# Patient Record
Sex: Male | Born: 1970 | Race: Black or African American | Hispanic: No | Marital: Married | Smoking: Current every day smoker
Health system: Southern US, Community
[De-identification: ages and names within clinical notes are randomized; demographics above are authoritative.]

## PROBLEM LIST (undated history)

## (undated) DIAGNOSIS — I10 Essential (primary) hypertension: Secondary | ICD-10-CM

---

## 2001-08-13 ENCOUNTER — Emergency Department (HOSPITAL_COMMUNITY): Admission: EM | Admit: 2001-08-13 | Discharge: 2001-08-13 | Payer: Self-pay | Admitting: Emergency Medicine

## 2001-10-27 ENCOUNTER — Emergency Department (HOSPITAL_COMMUNITY): Admission: EM | Admit: 2001-10-27 | Discharge: 2001-10-27 | Payer: Self-pay

## 2006-02-13 ENCOUNTER — Emergency Department (HOSPITAL_COMMUNITY): Admission: EM | Admit: 2006-02-13 | Discharge: 2006-02-13 | Payer: Self-pay | Admitting: Emergency Medicine

## 2007-09-04 ENCOUNTER — Emergency Department (HOSPITAL_COMMUNITY): Admission: EM | Admit: 2007-09-04 | Discharge: 2007-09-04 | Payer: Self-pay | Admitting: Physician Assistant

## 2014-08-10 ENCOUNTER — Encounter (HOSPITAL_COMMUNITY): Payer: Self-pay

## 2014-08-10 ENCOUNTER — Emergency Department (HOSPITAL_COMMUNITY)
Admission: EM | Admit: 2014-08-10 | Discharge: 2014-08-10 | Disposition: A | Discharge status: Home / Self Care | Payer: Self-pay | Attending: Emergency Medicine | Admitting: Emergency Medicine

## 2014-08-10 ENCOUNTER — Emergency Department (HOSPITAL_COMMUNITY): Payer: Self-pay

## 2014-08-10 DIAGNOSIS — Z720 Tobacco use: Secondary | ICD-10-CM | POA: Insufficient documentation

## 2014-08-10 DIAGNOSIS — B349 Viral infection, unspecified: Secondary | ICD-10-CM | POA: Insufficient documentation

## 2014-08-10 MED ORDER — KETOROLAC TROMETHAMINE 30 MG/ML IJ SOLN
30.0000 mg | Freq: Once | INTRAMUSCULAR | Status: AC
  Administered 2014-08-10: 30 mg via INTRAMUSCULAR
  Filled 2014-08-10: qty 1

## 2014-08-10 NOTE — ED Notes (Signed)
Pt states cough and cold like symptoms since Wednesday.  Chills but has not measured for fever.

## 2014-08-10 NOTE — ED Notes (Signed)
Pr alert x4 respirations easy non labored.

## 2014-08-10 NOTE — ED Provider Notes (Signed)
CSN: [id number]     Arrival date & time 08/10/14  4098 History   First MD Initiated Contact with Patient 08/10/14 0827     Chief Complaint  Patient presents with  . Cough  . Nasal Congestion     (Consider location/radiation/quality/duration/timing/severity/associated sxs/prior Treatment) Patient is a 44 y.o. male presenting with cough. The history is provided by the patient.  Cough Cough characteristics:  Non-productive Severity:  Mild Onset quality:  Gradual Duration:  4 days Timing:  Intermittent Progression:  Unchanged Chronicity:  New Smoker: yes   Context: upper respiratory infection   Relieved by:  Nothing Worsened by:  Nothing tried Ineffective treatments:  None tried Associated symptoms: chills, fever (subjective), myalgias and rhinorrhea   Associated symptoms: no chest pain, no headaches and no shortness of breath     History reviewed. No pertinent past medical history. History reviewed. No pertinent past surgical history. History reviewed. No pertinent family history. History  Substance Use Topics  . Smoking status: Current Every Day Smoker  . Smokeless tobacco: Not on file  . Alcohol Use: Yes     Comment: social    Review of Systems  Constitutional: Positive for fever (subjective) and chills.  HENT: Positive for rhinorrhea. Negative for drooling.   Eyes: Negative for pain.  Respiratory: Positive for cough. Negative for shortness of breath.   Cardiovascular: Negative for chest pain and leg swelling.  Gastrointestinal: Positive for diarrhea. Negative for nausea, vomiting and abdominal pain.  Genitourinary: Negative for dysuria and hematuria.       Right flank pain w/ heavy coughing  Musculoskeletal: Positive for myalgias. Negative for gait problem and neck pain.  Skin: Negative for color change.  Neurological: Negative for numbness and headaches.  Hematological: Negative for adenopathy.  Psychiatric/Behavioral: Negative for behavioral problems.  All  other systems reviewed and are negative.     Allergies  Review of patient's allergies indicates no known allergies.  Home Medications   Prior to Admission medications   Not on File   BP 128/88 mmHg  Pulse 83  Temp(Src) 99.2 F (37.3 C) (Oral)  Resp 18  SpO2 99% Physical Exam  Constitutional: He is oriented to person, place, and time. He appears well-developed and well-nourished.  HENT:  Head: Normocephalic and atraumatic.  Right Ear: External ear normal.  Left Ear: External ear normal.  Nose: Nose normal.  Mouth/Throat: Oropharynx is clear and moist. No oropharyngeal exudate.  Eyes: Conjunctivae and EOM are normal. Pupils are equal, round, and reactive to light.  Neck: Normal range of motion. Neck supple.  Cardiovascular: Normal rate, regular rhythm, normal heart sounds and intact distal pulses.  Exam reveals no gallop and no friction rub.   No murmur heard. Pulmonary/Chest: Effort normal and breath sounds normal. No respiratory distress. He has no wheezes.  Abdominal: Soft. Bowel sounds are normal. He exhibits no distension. There is no tenderness. There is no rebound and no guarding.  Musculoskeletal: Normal range of motion. He exhibits no edema or tenderness.  Neurological: He is alert and oriented to person, place, and time.  Skin: Skin is warm and dry.  Psychiatric: He has a normal mood and affect. His behavior is normal.  Nursing note and vitals reviewed.   ED Course  Procedures (including critical care time) Labs Review Labs Reviewed - No data to display  Imaging Review No results found.   EKG Interpretation None      MDM   Final diagnoses:  Viral syndrome    8:41 AM 43  y.o. male here w/ viral sx x 6 days. Subj fever, diarrhea, myalgias, sore throat, cough. AFVSS here. Appears well on exam. No need for imaging at this time.   9:02 AM:  I have discussed the diagnosis/risks/treatment options with the patient and believe the pt to be eligible for  discharge home to follow-up with his pcp as needed. We also discussed returning to the ED immediately if new or worsening sx occur. We discussed the sx which are most concerning (e.g., sob, intractable fever) that necessitate immediate return. Medications administered to the patient during their visit and any new prescriptions provided to the patient are listed below.  Medications given during this visit Medications  ketorolac (TORADOL) 30 MG/ML injection 30 mg (30 mg Intramuscular Given 08/10/14 0851)    New Prescriptions   No medications on file       Purvis SheffieldForrest Jamoni Hewes, MD 08/10/14 [phone]

## 2014-08-10 NOTE — Discharge Instructions (Signed)
Cough, Adult   A cough is a reflex. It helps you clear your throat and airways. A cough can help heal your body. A cough can last 2 or 3 weeks (acute) or may last more than 8 weeks (chronic). Some common causes of a cough can include an infection, allergy, or a cold.  HOME CARE  · Only take medicine as told by your doctor.  · If given, take your medicines (antibiotics) as told. Finish them even if you start to feel better.  · Use a cold steam vaporizer or humidifier in your home. This can help loosen thick spit (secretions).  · Sleep so you are almost sitting up (semi-upright). Use pillows to do this. This helps reduce coughing.  · Rest as needed.  · Stop smoking if you smoke.  GET HELP RIGHT AWAY IF:  · You have yellowish-white fluid (pus) in your thick spit.  · Your cough gets worse.  · Your medicine does not reduce coughing, and you are losing sleep.  · You cough up blood.  · You have trouble breathing.  · Your pain gets worse and medicine does not help.  · You have a fever.  MAKE SURE YOU:   · Understand these instructions.  · Will watch your condition.  · Will get help right away if you are not doing well or get worse.  Document Released: 02/26/2011 Document Revised: 10/30/2013 Document Reviewed: 02/26/2011  ExitCare® Patient Information ©2015 [employer]. This information is not intended to replace advice given to you by your health care provider. Make sure you discuss any questions you have with your health care provider.

## 2014-12-14 ENCOUNTER — Encounter (HOSPITAL_COMMUNITY): Payer: Self-pay | Admitting: Emergency Medicine

## 2014-12-14 ENCOUNTER — Emergency Department (INDEPENDENT_AMBULATORY_CARE_PROVIDER_SITE_OTHER)
Admission: EM | Admit: 2014-12-14 | Discharge: 2014-12-14 | Disposition: A | Discharge status: Home / Self Care | Payer: Self-pay | Source: Home / Self Care | Attending: Family Medicine | Admitting: Family Medicine

## 2014-12-14 DIAGNOSIS — B9789 Other viral agents as the cause of diseases classified elsewhere: Principal | ICD-10-CM

## 2014-12-14 DIAGNOSIS — J069 Acute upper respiratory infection, unspecified: Secondary | ICD-10-CM

## 2014-12-14 LAB — POCT RAPID STREP A: Streptococcus, Group A Screen (Direct): NEGATIVE

## 2014-12-14 MED ORDER — GUAIFENESIN ER 1200 MG PO TB12: 1.0000 | ORAL_TABLET | Freq: Two times a day (BID) | ORAL | Status: AC

## 2014-12-14 MED ORDER — ALBUTEROL SULFATE HFA 108 (90 BASE) MCG/ACT IN AERS: 1.0000 | INHALATION_SPRAY | Freq: Four times a day (QID) | RESPIRATORY_TRACT | Status: DC | PRN

## 2014-12-14 MED ORDER — HYDROCOD POLST-CPM POLST ER 10-8 MG/5ML PO SUER: 5.0000 mL | Freq: Every evening | ORAL | Status: DC | PRN

## 2014-12-14 NOTE — ED Provider Notes (Signed)
CSN: [id number]     Arrival date & time 12/14/14  1439 History   First MD Initiated Contact with Patient 12/14/14 1635     Chief Complaint  Patient presents with  . Sore Throat   (Consider location/radiation/quality/duration/timing/severity/associated sxs/prior Treatment) HPI  Pt presents to clinic with 3 day h/o cold symptoms that acutely started.  He has congestion with yellow rhinorrhea and PND with cough with yellow colored sputum.  He has had some chills and subjective fever.  He is coughing and it is keeping him up.  He missed work yesterday and works 3rd shift.  He has h/o asthma but he does not have and inhaler.  He is having some SOB and wheezing.  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  History reviewed. No pertinent past medical history. History reviewed. No pertinent past surgical history. History reviewed. No pertinent family history. History  Substance Use Topics  . Smoking status: Current Every Day Smoker  . Smokeless tobacco: Not on file  . Alcohol Use: Yes     Comment: social    Review of Systems  Constitutional: Positive for fever (subjective) and chills.  HENT: Positive for congestion, postnasal drip, rhinorrhea (yellow) and sore throat (worse as the day goes on).   Respiratory: Positive for cough (yellow), shortness of breath and wheezing.        H/o asthma, occ smoker  Gastrointestinal: Negative.   Allergic/Immunologic: Negative for environmental allergies.  Psychiatric/Behavioral: Positive for sleep disturbance (2nd to cough).    Allergies  Review of patient's allergies indicates no known allergies.  Home Medications   Prior to Admission medications   Medication Sig Start Date End Date Taking? Authorizing Provider  Phenyleph-Doxylamine-DM-APAP 5-6.25-10-325 MG/15ML LIQD Take 30 mLs by mouth at bedtime as needed (for cold/sleep).    Historical Provider, MD   BP 139/95 mmHg  Pulse 78  Temp(Src) 99.4 F (37.4 C) (Oral)  Resp 16  SpO2  97% Physical Exam  Constitutional: He is oriented to person, place, and time. He appears well-developed and well-nourished.  BP 139/95 mmHg  Pulse 78  Temp(Src) 99.4 F (37.4 C) (Oral)  Resp 16  SpO2 97%   HENT:  Head: Normocephalic and atraumatic.  Right Ear: Hearing, tympanic membrane, external ear and ear canal normal.  Left Ear: Hearing, tympanic membrane, external ear and ear canal normal.  Nose: Mucosal edema: red.  Mouth/Throat: Uvula is midline, oropharynx is clear and moist and mucous membranes are normal.  Eyes: Conjunctivae are normal.  Neck: Normal range of motion.  Cardiovascular: Normal rate, regular rhythm and normal heart sounds.   No murmur heard. Pulmonary/Chest: Effort normal and breath sounds normal. He has no wheezes.  Neurological: He is alert and oriented to person, place, and time.  Skin: Skin is warm and dry.  Psychiatric: He has a normal mood and affect. His behavior is normal. Judgment and thought content normal.    ED Course  Procedures (including critical care time) Labs Review Labs Reviewed  POCT RAPID STREP A    Imaging Review No results found.   MDM  Viral URI with cough - Plan: Guaifenesin (MUCINEX MAXIMUM STRENGTH) 1200 MG TB12, chlorpheniramine-HYDROcodone (TUSSIONEX PENNKINETIC ER) 10-8 MG/5ML SUER, albuterol (PROVENTIL HFA;VENTOLIN HFA) 108 (90 BASE) MCG/ACT inhaler   Symptomatic treatment.  Ok to be out of work for the next 48h for rest.  Benny Lennert PA-C   12/14/2014 4:52 PM      Morrell Riddle, PA-C 12/14/14 [phone]

## 2014-12-14 NOTE — ED Notes (Signed)
Co sore throat  States he has body ache, coughing, runny nose nyquil was used as tx

## 2014-12-14 NOTE — Discharge Instructions (Signed)
Please push fluids.  Tylenol and Motrin for fever and body aches.    Use your inhaler is you are having trouble breathing with that cough.

## 2014-12-17 LAB — CULTURE, GROUP A STREP: STREP A CULTURE: NEGATIVE

## 2014-12-18 NOTE — ED Notes (Signed)
Final report of strep negative. no further action required 

## 2017-04-24 ENCOUNTER — Emergency Department (HOSPITAL_COMMUNITY): Payer: Commercial Managed Care - PPO

## 2017-04-24 ENCOUNTER — Emergency Department (HOSPITAL_COMMUNITY)
Admission: EM | Admit: 2017-04-24 | Discharge: 2017-04-24 | Disposition: A | Discharge status: Home / Self Care | Payer: Commercial Managed Care - PPO | Attending: Emergency Medicine | Admitting: Emergency Medicine

## 2017-04-24 ENCOUNTER — Ambulatory Visit: Payer: Self-pay | Admitting: Osteopathic Medicine

## 2017-04-24 ENCOUNTER — Encounter (HOSPITAL_COMMUNITY): Payer: Self-pay | Admitting: Emergency Medicine

## 2017-04-24 DIAGNOSIS — F17200 Nicotine dependence, unspecified, uncomplicated: Secondary | ICD-10-CM | POA: Diagnosis not present

## 2017-04-24 DIAGNOSIS — M79645 Pain in left finger(s): Secondary | ICD-10-CM | POA: Diagnosis not present

## 2017-04-24 NOTE — Discharge Instructions (Signed)
It was my pleasure taking care of you today!   Epson salt soaks and ibuprofen as needed for pain.   Follow up with your primary care provider if no improvement in 4-5 days.  If you do not have a primary care provider, please see the information below.  Return to the ER for new or worsening symptoms, any additional concerns.  To find a primary care or specialty doctor please call [phone] or [phone] to access "[facility] Find a Doctor Service."  You may also go on the [facility] website at [email or web address]  There are also multiple [facility] and [facility] practices throughout the [place] that are frequently accepting new patients. You may find a clinic that is close to your home and contact them.  [facility] - [address] [place] [phone]  [facility] in [place] (also locations in [place] and [place]) - [address] [place] [phone]  [facility] - [address] [place] [phone]

## 2017-04-24 NOTE — ED Provider Notes (Signed)
[facility]-EMERGENCY DEPT Provider Note   CSN: [id number] Arrival date & time: 04/24/17  1244     History   Chief Complaint Chief Complaint  Patient presents with  . left finger pain    HPI Lawrence Alexander is a 46 y.o. male.  The history is provided by the patient and medical records. No language interpreter was used.   Lawrence Alexander is an otherwise heatlhy 46 y.o. male  who presents to the Emergency Department complaining of soreness to the left index finger which began at work yesterday. Patient states that the knuckle (PIP joint) is sore to the touch. He works with his hands cutting aluminum. Always wears gloves. Denies recent cuts or open wounds. His safety officer did not see any signs of entry wound, but sent patient to ER for further evaluation. No numbness, tingling, weakness. No medications taken prior to arrival for symptoms.  History reviewed. No pertinent past medical history.  There are no active problems to display for this patient.   History reviewed. No pertinent surgical history.     Home Medications    Prior to Admission medications   Medication Sig Start Date End Date Taking? Authorizing Provider  albuterol (PROVENTIL HFA;VENTOLIN HFA) 108 (90 BASE) MCG/ACT inhaler Inhale 1-2 puffs into the lungs every 6 (six) hours as needed for wheezing or shortness of breath. 12/14/14   Weber, Dema SeverinSarah L, PA-C  chlorpheniramine-HYDROcodone (TUSSIONEX PENNKINETIC ER) 10-8 MG/5ML SUER Take 5 mLs by mouth at bedtime as needed for cough. 12/14/14   Weber, Dema SeverinSarah L, PA-C  Phenyleph-Doxylamine-DM-APAP 5-6.25-10-325 MG/15ML LIQD Take 30 mLs by mouth at bedtime as needed (for cold/sleep).    [provider]    Family History No family history on file.  Social History Social History  Substance Use Topics  . Smoking status: Current Every Day Smoker  . Smokeless tobacco: Not on file  . Alcohol use Yes     Comment: social     Allergies     Patient has no known allergies.   Review of Systems Review of Systems  Constitutional: Negative for chills and fever.  Musculoskeletal: Positive for arthralgias and myalgias.  Skin: Negative for color change and wound.  Neurological: Negative for weakness and numbness.     Physical Exam Updated Vital Signs BP 138/90 (BP Location: Right Arm)   Pulse 75   Temp 98.6 F (37 C)   Resp 16   SpO2 99%   Physical Exam  Constitutional: He appears well-developed and well-nourished. No distress.  HENT:  Head: Normocephalic and atraumatic.  Neck: Neck supple.  Cardiovascular: Normal rate, regular rhythm and normal heart sounds.   No murmur heard. Pulmonary/Chest: Effort normal and breath sounds normal. No respiratory distress. He has no wheezes. He has no rales.  Musculoskeletal: Normal range of motion.  Left hand with full ROM. Left PIP joint of index finger tender to palpation. No redness, warmth. No open wound. Good cap refill and sensation intact.   Neurological: He is alert.  Skin: Skin is warm and dry.  Nursing note and vitals reviewed.    ED Treatments / Results  Labs (all labs ordered are listed, but only abnormal results are displayed) Labs Reviewed - No data to display  EKG  EKG Interpretation None       Radiology Dg Finger Index Left  Result Date: 04/24/2017 CLINICAL DATA:  Left index finger pain. Patient works with Designer, jewellerysteel and power tools. EXAM: LEFT INDEX FINGER 2+V COMPARISON:  None.  FINDINGS: Negative for a fracture or dislocation. Alignment of the left index finger is normal. There is a small metallic density in the soft tissues between the thumb and the index finger. This metallic density measures roughly 2 cm and suggestive for a small foreign body. Soft tissue prominence or edema along the radial aspect of the proximal index finger. IMPRESSION: No acute bone abnormality involving the left index finger. Small metallic foreign body in the soft tissues between  the thumb and index finger. Electronically Signed   By: Richarda Overlie M.D.   On: 04/24/2017 13:34   Spoke with radiology - foreign body is approximately 2 mm not 2 cm as listed in report.   Procedures Procedures (including critical care time)  Medications Ordered in ED Medications - No data to display   Initial Impression / Assessment and Plan / ED Course  I have reviewed the triage vital signs and the nursing notes.  Pertinent labs & imaging results that were available during my care of the patient were reviewed by me and considered in my medical decision making (see chart for details).    Lawrence Alexander is a 46 y.o. male who presents to ED for left index finger pain. LUE NVI with no open wounds, erythema, warmth or swelling. X-ray with no acute bony abnormalities. 1.7 mm metallic foreign body incidentally found on imaging in soft tissue between thumb and index finger. Patient notes working with metal and had an injury several weeks ago where he believes a piece of aluminum could have gotten under the skin. Wound is healed with no current break in skin or signs of infection. No tenderness or palpable foreign body appreciated. Discussed risks and benefits of incision to remove piece of metal. Patient opts for observation of the area given no signs of infection or pain at the site. Discussed symptomatic home care instructions and return precautions. PCP follow up if index finger pain does not improve. All questions answered.   Final Clinical Impressions(s) / ED Diagnoses   Final diagnoses:  Finger pain, left    New Prescriptions Discharge Medication List as of 04/24/2017  2:35 PM       Trejon Duford, Chase Picket, PA-C 04/24/17 1605    Arby Barrette, MD 04/24/17 1758

## 2017-04-24 NOTE — ED Triage Notes (Signed)
Per pt, states he was at work yesterday and noticed his left index finger was swollen and sore-states he continued working-woke up this am with continued swelling and pain-was advised by work Psychologist, occupationalsafety officer to seek evaluation

## 2017-09-05 ENCOUNTER — Encounter (HOSPITAL_COMMUNITY): Payer: Self-pay

## 2017-09-05 ENCOUNTER — Emergency Department (HOSPITAL_COMMUNITY)
Admission: EM | Admit: 2017-09-05 | Discharge: 2017-09-05 | Disposition: A | Discharge status: Home / Self Care | Payer: Commercial Managed Care - PPO | Attending: Emergency Medicine | Admitting: Emergency Medicine

## 2017-09-05 DIAGNOSIS — H1033 Unspecified acute conjunctivitis, bilateral: Secondary | ICD-10-CM | POA: Insufficient documentation

## 2017-09-05 DIAGNOSIS — Z79899 Other long term (current) drug therapy: Secondary | ICD-10-CM | POA: Insufficient documentation

## 2017-09-05 DIAGNOSIS — F17210 Nicotine dependence, cigarettes, uncomplicated: Secondary | ICD-10-CM | POA: Insufficient documentation

## 2017-09-05 MED ORDER — POLYMYXIN B-TRIMETHOPRIM 10000-0.1 UNIT/ML-% OP SOLN: 1.0000 [drp] | OPHTHALMIC | 0 refills | Status: AC

## 2017-09-05 NOTE — ED Provider Notes (Signed)
[facility]-EMERGENC[facility] DEPT Provider Note   CSN: [id number] Arrival date & time: 09/05/17  2027     History   Chief Complaint Chief Complaint  Patient presents with  . Conjunctivitis    HPI Lawrence Alexander is a 47 y.o. male.  HPI  47 year old male presents with bilateral eye redness, drainage, and pain.  Started 2 days ago.  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  He does not remember specifically being near a child or person who had red eyes.  However he states later that day he developed the eye redness in the right eye and then the next day in the left eye.  The right eye has been worse.  There is some soreness and pain as well as itching and discharge.  Is not sure what color the discharges.  He has also noticed matting when he first wakes up in the morning.  He denies any other symptoms such as fever, cough, congestion or sore throat.  He does not wear contacts but does wear glasses.  He has blurry vision when he first wakes up but then after blinking it goes away and is normal vision throughout the day.  History reviewed. No pertinent past medical history.  There are no active problems to display for this patient.   History reviewed. No pertinent surgical history.     Home Medications    Prior to Admission medications   Medication Sig Start Date End Date Taking? Authorizing Provider  albuterol (PROVENTIL HFA;VENTOLIN HFA) 108 (90 BASE) MCG/ACT inhaler Inhale 1-2 puffs into the lungs every 6 (six) hours as needed for wheezing or shortness of breath. 12/14/14   Weber, Dema Severin, PA-C  chlorpheniramine-HYDROcodone (TUSSIONEX PENNKINETIC ER) 10-8 MG/5ML SUER Take 5 mLs by mouth at bedtime as needed for cough. 12/14/14   Weber, Dema Severin, PA-C  Phenyleph-Doxylamine-DM-APAP 5-6.25-10-325 MG/15ML LIQD Take 30 mLs by mouth at bedtime as needed (for cold/sleep).    [provider]    trimethoprim-polymyxin b (POLYTRIM) ophthalmic solution Place 1 drop into both eyes every 3 (three) hours while awake for 7 days. 1 drop bilateral eyes q3 hours while awake, not to exceed 6 times in one day 09/05/17 09/12/17  Pricilla Loveless, MD    Family History History reviewed. No pertinent family history.  Social History Social History   Tobacco Use  . Smoking status: Current Every Day Smoker  . Smokeless tobacco: Never Used  Substance Use Topics  . Alcohol use: Yes    Comment: social  . Drug use: No     Allergies   Patient has no known allergies.   Review of Systems Review of Systems  Constitutional: Negative for fever.  HENT: Negative for sore throat.   Eyes: Positive for pain, discharge, redness and itching. Negative for visual disturbance.  Respiratory: Negative for cough and shortness of breath.   All other systems reviewed and are negative.    Physical Exam Updated Vital Signs BP (!) 154/102 (BP Location: Right Arm)   Pulse (!) 103   Temp 99.2 F (37.3 C) (Oral)   Resp 20   SpO2 98%   Physical Exam  Constitutional: He is oriented to person, place, and time. He appears well-developed and well-nourished.  HENT:  Head: Normocephalic and atraumatic.  Right Ear: External ear normal.  Left Ear: External ear normal.  Nose: Nose normal.  Eyes: EOM are normal. Pupils are equal, round,  and reactive to light. Right eye exhibits discharge. Left eye exhibits no discharge. Right conjunctiva is injected. Left conjunctiva is injected.  Diffuse conjunctival injection/irritation c/w conjunctivitis Small yellow discharge to medial corner of right eye.  Neck: Neck supple.  Cardiovascular: Normal rate, regular rhythm and normal heart sounds.  Pulmonary/Chest: Effort normal and breath sounds normal.  Musculoskeletal: He exhibits no edema.  Neurological: He is alert and oriented to person, place, and time.  Skin: Skin is warm and dry.  Nursing note and vitals  reviewed.    ED Treatments / Results  Labs (all labs ordered are listed, but only abnormal results are displayed) Labs Reviewed - No data to display  EKG  EKG Interpretation None       Radiology No results found.  Procedures Procedures (including critical care time)  Medications Ordered in ED Medications - No data to display   Initial Impression / Assessment and Plan / ED Course  I have reviewed the triage vital signs and the nursing notes.  Pertinent labs & imaging results that were available during my care of the patient were reviewed by me and considered in my medical decision making (see chart for details).     Patient's presentation is consistent with conjunctivitis.  He does not wear contact lenses.  Will be covered with Polytrim drops.  Referral to ophthalmology but should otherwise heal unremarkably.  Return precautions.  Final Clinical Impressions(s) / ED Diagnoses   Final diagnoses:  Acute conjunctivitis of both eyes, unspecified acute conjunctivitis type    ED Discharge Orders        Ordered    trimethoprim-polymyxin b (POLYTRIM) ophthalmic solution  Every  3 hours while awake     09/05/17 2225       Pricilla LovelessGoldston, Cason Dabney, MD 09/05/17 2241

## 2017-09-05 NOTE — ED Notes (Signed)
Bed: WTR9 Expected date:  Expected time:  Means of arrival:  Comments: 

## 2017-09-05 NOTE — ED Triage Notes (Signed)
Pt complains of red itchy eyes for two days after going into a daycare

## 2018-03-12 ENCOUNTER — Emergency Department (HOSPITAL_COMMUNITY)
Admission: EM | Admit: 2018-03-12 | Discharge: 2018-03-13 | Disposition: A | Discharge status: Home / Self Care | Payer: No Typology Code available for payment source | Attending: Emergency Medicine | Admitting: Emergency Medicine

## 2018-03-12 ENCOUNTER — Encounter (HOSPITAL_COMMUNITY): Payer: Self-pay | Admitting: Emergency Medicine

## 2018-03-12 DIAGNOSIS — Y92410 Unspecified street and highway as the place of occurrence of the external cause: Secondary | ICD-10-CM | POA: Insufficient documentation

## 2018-03-12 DIAGNOSIS — F17210 Nicotine dependence, cigarettes, uncomplicated: Secondary | ICD-10-CM | POA: Insufficient documentation

## 2018-03-12 DIAGNOSIS — Y939 Activity, unspecified: Secondary | ICD-10-CM | POA: Diagnosis not present

## 2018-03-12 DIAGNOSIS — Y999 Unspecified external cause status: Secondary | ICD-10-CM | POA: Insufficient documentation

## 2018-03-12 DIAGNOSIS — S92135A Nondisplaced fracture of posterior process of left talus, initial encounter for closed fracture: Secondary | ICD-10-CM | POA: Diagnosis not present

## 2018-03-12 DIAGNOSIS — I10 Essential (primary) hypertension: Secondary | ICD-10-CM | POA: Diagnosis not present

## 2018-03-12 DIAGNOSIS — R55 Syncope and collapse: Secondary | ICD-10-CM | POA: Diagnosis not present

## 2018-03-12 DIAGNOSIS — S99912A Unspecified injury of left ankle, initial encounter: Secondary | ICD-10-CM | POA: Diagnosis present

## 2018-03-12 HISTORY — DX: Essential (primary) hypertension: I10

## 2018-03-12 MED ORDER — OXYCODONE-ACETAMINOPHEN 5-325 MG PO TABS
1.0000 | ORAL_TABLET | ORAL | Status: DC | PRN
  Administered 2018-03-12: 1 via ORAL
  Filled 2018-03-12: qty 1
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
## 2018-03-13 ENCOUNTER — Emergency Department (HOSPITAL_COMMUNITY): Payer: No Typology Code available for payment source

## 2018-03-13 ENCOUNTER — Encounter (HOSPITAL_COMMUNITY): Payer: Self-pay | Admitting: Emergency Medicine

## 2018-03-13 LAB — I-STAT CHEM 8, ED
BUN: 10 mg/dL (ref 6–20)
BUN: 6 mg/dL (ref 6–20)
CREATININE: 1.6 mg/dL — AB (ref 0.61–1.24)
CREATININE: 1.8 mg/dL — AB (ref 0.61–1.24)
Calcium, Ion: 1.08 mmol/L — ABNORMAL LOW (ref 1.15–1.40)
Calcium, Ion: 1.13 mmol/L — ABNORMAL LOW (ref 1.15–1.40)
Chloride: 102 mmol/L (ref 98–111)
Chloride: 110 mmol/L (ref 98–111)
Glucose, Bld: 91 mg/dL (ref 70–99)
Glucose, Bld: 98 mg/dL (ref 70–99)
HEMATOCRIT: 41 % (ref 39.0–52.0)
HEMATOCRIT: 47 % (ref 39.0–52.0)
HEMOGLOBIN: 13.9 g/dL (ref 13.0–17.0)
Hemoglobin: 16 g/dL (ref 13.0–17.0)
POTASSIUM: 3.5 mmol/L (ref 3.5–5.1)
Potassium: 4 mmol/L (ref 3.5–5.1)
SODIUM: 144 mmol/L (ref 135–145)
Sodium: 139 mmol/L (ref 135–145)
TCO2: 23 mmol/L (ref 22–32)
TCO2: 26 mmol/L (ref 22–32)

## 2018-03-13 LAB — SAMPLE TO BLOOD BANK

## 2018-03-13 LAB — URINALYSIS, ROUTINE W REFLEX MICROSCOPIC
BACTERIA UA: NONE SEEN
Bilirubin Urine: NEGATIVE
GLUCOSE, UA: NEGATIVE mg/dL
Ketones, ur: NEGATIVE mg/dL
Leukocytes, UA: NEGATIVE
Nitrite: NEGATIVE
PH: 6 (ref 5.0–8.0)
PROTEIN: NEGATIVE mg/dL
Specific Gravity, Urine: 1.001 — ABNORMAL LOW (ref 1.005–1.030)

## 2018-03-13 LAB — CBC
HEMATOCRIT: 45.4 % (ref 39.0–52.0)
HEMOGLOBIN: 15 g/dL (ref 13.0–17.0)
MCH: 31.9 pg (ref 26.0–34.0)
MCHC: 33 g/dL (ref 30.0–36.0)
MCV: 96.6 fL (ref 78.0–100.0)
Platelets: 258 10*3/uL (ref 150–400)
RBC: 4.7 MIL/uL (ref 4.22–5.81)
RDW: 13.2 % (ref 11.5–15.5)
WBC: 12.9 10*3/uL — ABNORMAL HIGH (ref 4.0–10.5)

## 2018-03-13 LAB — ETHANOL: Alcohol, Ethyl (B): 177 mg/dL — ABNORMAL HIGH (ref <10)

## 2018-03-13 LAB — PROTIME-INR
INR: 0.99
Prothrombin Time: 13 seconds (ref 11.4–15.2)

## 2018-03-13 LAB — I-STAT CG4 LACTIC ACID, ED: Lactic Acid, Venous: 1.61 mmol/L (ref 0.5–1.9)

## 2018-03-13 MED ORDER — KETOROLAC TROMETHAMINE 30 MG/ML IJ SOLN
15.0000 mg | Freq: Once | INTRAMUSCULAR | Status: AC
  Administered 2018-03-13: 15 mg via INTRAVENOUS
  Filled 2018-03-13: qty 1

## 2018-03-13 MED ORDER — SODIUM CHLORIDE 0.9 % IV BOLUS
1000.0000 mL | Freq: Once | INTRAVENOUS | Status: AC
  Administered 2018-03-13: 1000 mL via INTRAVENOUS

## 2018-03-13 MED ORDER — IOHEXOL 300 MG/ML  SOLN
100.0000 mL | Freq: Once | INTRAMUSCULAR | Status: AC | PRN
  Administered 2018-03-13: 100 mL via INTRAVENOUS

## 2018-03-13 MED ORDER — DICLOFENAC SODIUM ER 100 MG PO TB24: 100.0000 mg | ORAL_TABLET | Freq: Every day | ORAL | 0 refills | Status: DC

## 2018-03-13 MED ORDER — LIDOCAINE 5 % EX PTCH
1.0000 | MEDICATED_PATCH | CUTANEOUS | Status: DC
  Administered 2018-03-13: 1 via TRANSDERMAL
  Filled 2018-03-13: qty 1
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
## 2018-03-13 NOTE — ED Notes (Signed)
Patient transported to CT 

## 2018-03-17 ENCOUNTER — Ambulatory Visit (INDEPENDENT_AMBULATORY_CARE_PROVIDER_SITE_OTHER): Payer: Self-pay | Admitting: Physician Assistant

## 2018-03-17 ENCOUNTER — Encounter (INDEPENDENT_AMBULATORY_CARE_PROVIDER_SITE_OTHER): Payer: Self-pay | Admitting: Physician Assistant

## 2018-03-17 DIAGNOSIS — S93402D Sprain of unspecified ligament of left ankle, subsequent encounter: Secondary | ICD-10-CM

## 2018-03-17 DIAGNOSIS — S93431D Sprain of tibiofibular ligament of right ankle, subsequent encounter: Secondary | ICD-10-CM

## 2018-03-17 MED ORDER — DICLOFENAC SODIUM ER 100 MG PO TB24: 100.0000 mg | ORAL_TABLET | Freq: Every day | ORAL | 0 refills | Status: AC
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
## 2018-03-28 ENCOUNTER — Ambulatory Visit (INDEPENDENT_AMBULATORY_CARE_PROVIDER_SITE_OTHER): Payer: Self-pay | Admitting: Orthopaedic Surgery

## 2018-03-30 ENCOUNTER — Ambulatory Visit (INDEPENDENT_AMBULATORY_CARE_PROVIDER_SITE_OTHER): Payer: Self-pay | Admitting: Orthopaedic Surgery

## 2018-04-11 ENCOUNTER — Encounter (INDEPENDENT_AMBULATORY_CARE_PROVIDER_SITE_OTHER): Payer: Self-pay | Admitting: Orthopaedic Surgery

## 2018-04-11 ENCOUNTER — Ambulatory Visit (INDEPENDENT_AMBULATORY_CARE_PROVIDER_SITE_OTHER): Payer: Self-pay

## 2018-04-11 ENCOUNTER — Ambulatory Visit (INDEPENDENT_AMBULATORY_CARE_PROVIDER_SITE_OTHER): Payer: Self-pay | Admitting: Orthopaedic Surgery

## 2018-04-11 DIAGNOSIS — S93431D Sprain of tibiofibular ligament of right ankle, subsequent encounter: Secondary | ICD-10-CM

## 2018-04-11 DIAGNOSIS — M25572 Pain in left ankle and joints of left foot: Secondary | ICD-10-CM

## 2018-04-11 DIAGNOSIS — S93402D Sprain of unspecified ligament of left ankle, subsequent encounter: Secondary | ICD-10-CM
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
## 2018-10-23 ENCOUNTER — Encounter (HOSPITAL_COMMUNITY): Payer: Self-pay | Admitting: *Deleted

## 2018-10-23 ENCOUNTER — Other Ambulatory Visit: Payer: Self-pay

## 2018-10-23 ENCOUNTER — Emergency Department (HOSPITAL_COMMUNITY)
Admission: EM | Admit: 2018-10-23 | Discharge: 2018-10-23 | Disposition: A | Discharge status: Home / Self Care | Payer: Self-pay | Attending: Emergency Medicine | Admitting: Emergency Medicine

## 2018-10-23 DIAGNOSIS — I10 Essential (primary) hypertension: Secondary | ICD-10-CM | POA: Insufficient documentation

## 2018-10-23 DIAGNOSIS — Z0279 Encounter for issue of other medical certificate: Secondary | ICD-10-CM | POA: Insufficient documentation

## 2018-10-23 DIAGNOSIS — F17210 Nicotine dependence, cigarettes, uncomplicated: Secondary | ICD-10-CM | POA: Insufficient documentation

## 2018-10-23 DIAGNOSIS — R197 Diarrhea, unspecified: Secondary | ICD-10-CM | POA: Insufficient documentation

## 2018-10-23 DIAGNOSIS — R112 Nausea with vomiting, unspecified: Secondary | ICD-10-CM | POA: Insufficient documentation

## 2018-10-23 DIAGNOSIS — Z79899 Other long term (current) drug therapy: Secondary | ICD-10-CM | POA: Insufficient documentation

## 2018-10-23 NOTE — ED Triage Notes (Signed)
Pt states he vomited Friday and had to leave work. Pt states he has felt fine since. Pt denies cough, fever, shortness of breath. Pt states he needs a note to go back to work.

## 2018-10-23 NOTE — ED Provider Notes (Signed)
[facility]-EMERGENCY DEPT Provider Note   CSN: [id number] Arrival date & time: 10/23/18  1320    History   Chief Complaint Chief Complaint  Patient presents with  . needs work note    HPI    Lawrence Alexander is a 48 y.o. male with a PMHx of HTN, who presents to the ED with complaints of needing a work note.  Patient states that last week on Wednesday and Thursday he had some diarrhea which was nonbloody, but that resolved.  Then on Friday at work he had one episode of nonbloody nonbilious emesis so he went home.  He has not tried anything for symptoms, no known aggravating factors.  He reports that all of his symptoms have resolved, however he needs a work note because he has missed work.  He feels better and has no ongoing symptoms at this time.  He denies any fevers, chills, cough, URI symptoms, CP, SOB, abdominal pain, ongoing n/v/d, constipation, dysuria, hematuria, numbness, tingling, focal weakness, or any other complaints at this time.  He denies any recent travel, sick contacts, or suspicious food intake.  The history is provided by the patient and medical records. No language interpreter was used.    Past Medical History:  Diagnosis Date  . Hypertension     There are no active problems to display for this patient.   History reviewed. No pertinent surgical history.      Home Medications    Prior to Admission medications   Medication Sig Start Date End Date Taking? Authorizing Provider  albuterol (PROVENTIL HFA;VENTOLIN HFA) 108 (90 BASE) MCG/ACT inhaler Inhale 1-2 puffs into the lungs every 6 (six) hours as needed for wheezing or shortness of breath. Patient not taking: Reported on 03/13/2018 12/14/14   Morrell Riddle, PA-C  chlorpheniramine-HYDROcodone [facility] PENNKINETIC ER) 10-8 MG/5ML SUER Take 5 mLs by mouth at bedtime as needed for cough. Patient not taking: Reported on 03/13/2018 12/14/14   Valarie Cones, Dema Severin, PA-C  Diclofenac Sodium CR 100  MG 24 hr tablet Take 1 tablet (100 mg total) by mouth daily. 03/17/18   Kirtland Bouchard, PA-C    Family History No family history on file.  Social History Social History   Tobacco Use  . Smoking status: Current Every Day Smoker  . Smokeless tobacco: Never Used  Substance Use Topics  . Alcohol use: Yes    Comment: social  . Drug use: No     Allergies   Patient has no known allergies.   Review of Systems Review of Systems  Constitutional: Negative for chills and fever.  HENT: Negative for rhinorrhea and sore throat.   Respiratory: Negative for cough and shortness of breath.   Cardiovascular: Negative for chest pain.  Gastrointestinal: Positive for diarrhea (resolved), nausea (resolved) and vomiting (resolved). Negative for abdominal pain and constipation.  Genitourinary: Negative for dysuria and hematuria.  Musculoskeletal: Negative for arthralgias and myalgias.  Skin: Negative for color change.  Allergic/Immunologic: Negative for immunocompromised state.  Neurological: Negative for weakness and numbness.  Psychiatric/Behavioral: Negative for confusion.   All other systems reviewed and are negative for acute change except as noted in the HPI.    Physical Exam Updated Vital Signs BP 133/86 (BP Location: Left Arm)   Pulse 81   Temp 98.7 F (37.1 C) (Oral)   Resp 18   SpO2 100%   Physical Exam Vitals signs and nursing note reviewed.  Constitutional:      General: He is not in  acute distress.    Appearance: Normal appearance. He is well-developed. He is not toxic-appearing.     Comments: Afebrile, nontoxic, NAD  HENT:     Head: Normocephalic and atraumatic.  Eyes:     General:        Right eye: No discharge.        Left eye: No discharge.     Conjunctiva/sclera: Conjunctivae normal.  Neck:     Musculoskeletal: Normal range of motion and neck supple.  Cardiovascular:     Rate and Rhythm: Normal rate and regular rhythm.     Pulses: Normal pulses.     Heart  sounds: Normal heart sounds, S1 normal and S2 normal. No murmur. No friction rub. No gallop.   Pulmonary:     Effort: Pulmonary effort is normal. No respiratory distress.     Breath sounds: Normal breath sounds. No decreased breath sounds, wheezing, rhonchi or rales.  Abdominal:     General: Bowel sounds are normal. There is no distension.     Palpations: Abdomen is soft. Abdomen is not rigid.     Tenderness: There is no abdominal tenderness. There is no right CVA tenderness, left CVA tenderness, guarding or rebound. Negative signs include Murphy's sign and McBurney's sign.     Comments: Soft, NTND, +BS throughout, no r/g/r, neg murphy's, neg mcburney's, no CVA TTP   Musculoskeletal: Normal range of motion.  Skin:    General: Skin is warm and dry.     Findings: No rash.  Neurological:     Mental Status: He is alert and oriented to person, place, and time.     Sensory: Sensation is intact. No sensory deficit.     Motor: Motor function is intact.  Psychiatric:        Mood and Affect: Mood and affect normal.        Behavior: Behavior normal.      ED Treatments / Results  Labs (all labs ordered are listed, but only abnormal results are displayed) Labs Reviewed - No data to display  EKG None  Radiology No results found.  Procedures Procedures (including critical care time)  Medications Ordered in ED Medications - No data to display   Initial Impression / Assessment and Plan / ED Course  I have reviewed the triage vital signs and the nursing notes.  Pertinent labs & imaging results that were available during my care of the patient were reviewed by me and considered in my medical decision making (see chart for details).        48 y.o. male here with nausea, vomiting, diarrhea that occurred few days ago but have since resolved.  He just needs a work note today.  States that all of the symptoms have abated.  He has no abdominal tenderness on exam, well-appearing, vital signs  stable.  Doubt need for further emergent work-up or intervention at this time.  Will provide work note.  Advised over-the-counter remedies for any symptoms that occur, BRAT diet advised if diarrhea returns. F/up with PCP in 1wk for recheck. I explained the diagnosis and have given explicit precautions to return to the ER including for any other new or worsening symptoms. The patient understands and accepts the medical plan as it's been dictated and I have answered their questions. Discharge instructions concerning home care and prescriptions have been given. The patient is STABLE and is discharged to home in good condition.    Final Clinical Impressions(s) / ED Diagnoses   Final diagnoses:  Nausea  and vomiting in adult patient  Diarrhea, unspecified type    ED Discharge Orders    [address] [place], [place] 10/23/18 1408    Terrilee Files, MD 10/24/18 1147

## 2018-10-23 NOTE — Discharge Instructions (Signed)
Stay well hydrated. Alternate between tylenol and motrin as needed for pain. May consider using over the counter tums, maalox, pepto bismol, or other over the counter remedies to help with symptoms if they return. Stay well hydrated with small sips of fluids throughout the day. Follow a BRAT (banana-rice-applesauce-toast) diet as described below if your diarrhea returns. The 'BRAT' diet is suggested, then progress to diet as tolerated as symptoms abate. Call your regular doctor if bloody stools, persistent diarrhea, vomiting, fever or abdominal pain. Follow up with your regular doctor in 1 week for recheck of symptoms. Return to ER for changing or worsening of symptoms.

## 2018-10-23 NOTE — ED Notes (Signed)
Bed: WTR8 Expected date:  Expected time:  Means of arrival:  Comments: 

## 2019-02-01 ENCOUNTER — Other Ambulatory Visit: Payer: Self-pay

## 2019-02-01 ENCOUNTER — Emergency Department (HOSPITAL_COMMUNITY)
Admission: EM | Admit: 2019-02-01 | Discharge: 2019-02-02 | Disposition: A | Discharge status: Home / Self Care | Payer: Self-pay | Attending: Emergency Medicine | Admitting: Emergency Medicine

## 2019-02-01 ENCOUNTER — Encounter (HOSPITAL_COMMUNITY): Payer: Self-pay

## 2019-02-01 DIAGNOSIS — Z79899 Other long term (current) drug therapy: Secondary | ICD-10-CM | POA: Insufficient documentation

## 2019-02-01 DIAGNOSIS — I10 Essential (primary) hypertension: Secondary | ICD-10-CM | POA: Insufficient documentation

## 2019-02-01 DIAGNOSIS — T7840XA Allergy, unspecified, initial encounter: Secondary | ICD-10-CM | POA: Insufficient documentation

## 2019-02-01 DIAGNOSIS — T63441A Toxic effect of venom of bees, accidental (unintentional), initial encounter: Secondary | ICD-10-CM

## 2019-02-01 DIAGNOSIS — T782XXA Anaphylactic shock, unspecified, initial encounter: Secondary | ICD-10-CM

## 2019-02-01 DIAGNOSIS — F17200 Nicotine dependence, unspecified, uncomplicated: Secondary | ICD-10-CM | POA: Insufficient documentation

## 2019-02-01 MED ORDER — FAMOTIDINE IN NACL 20-0.9 MG/50ML-% IV SOLN
20.0000 mg | Freq: Once | INTRAVENOUS | Status: AC
  Administered 2019-02-01: 22:00:00 20 mg via INTRAVENOUS
  Filled 2019-02-01: qty 50

## 2019-02-01 MED ORDER — METHYLPREDNISOLONE SODIUM SUCC 125 MG IJ SOLR
125.0000 mg | Freq: Once | INTRAMUSCULAR | Status: AC
Start: 2019-02-01 — End: 2019-02-01
  Administered 2019-02-01: 125 mg via INTRAVENOUS
  Filled 2019-02-01: qty 2

## 2019-02-01 MED ORDER — EPINEPHRINE 0.3 MG/0.3ML IJ SOAJ
INTRAMUSCULAR | Status: AC
  Administered 2019-02-01: 22:00:00
  Filled 2019-02-01: qty 0.3

## 2019-02-01 NOTE — ED Triage Notes (Signed)
Pt reports getting stung by a bee today, hives noted all over, denies any difficulty breathing but states his lips feel numb and "im breathing fast". Pt a.o

## 2019-02-01 NOTE — ED Provider Notes (Signed)
23:59: Assumed care of patient @ change of shift from Wyn Quaker PA-C pending re-assessment & disposition.   Please see prior provider note for full H&P.  Briefly patient is a 48 yo male who presented to the ED s/p bee sting with complaints of hives & sensation of lip swelling. 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 He took benadryl PTA.   Patient had benadryl PTA.  He was given epi, pepcid, & prednisone in the ED.   Physical Exam  BP 132/79   Pulse 76   Temp 98.4 F (36.9 C) (Oral)   Resp 11   SpO2 95%   Physical Exam Vitals signs and nursing note reviewed.  Constitutional:      General: He is not in acute distress.    Appearance: He is well-developed.  HENT:     Head: Normocephalic and atraumatic.     Mouth/Throat:     Comments: Uvula midline.  No appreciable intraoral swelling.  No angioedema.  Tolerating own secretions without difficulty.  Airway is patent. Eyes:     General:        Right eye: No discharge.        Left eye: No discharge.     Conjunctiva/sclera: Conjunctivae normal.  Cardiovascular:     Rate and Rhythm: Normal rate and regular rhythm.  Pulmonary:     Effort: Pulmonary effort is normal.     Breath sounds: Normal breath sounds. No stridor. No wheezing.  Neurological:     Mental Status: He is alert.     Comments: Clear speech.   Psychiatric:        Behavior: Behavior normal.        Thought Content: Thought content normal.     ED Course/Procedures   Clinical Course as of Feb 02 212  Wed Feb 01, 2019  2200 Patient has been re-evaluated and reports feeling better   [EH]  Thu Feb 02, 2019  0018 Patient re-evaluated, he is sleeping in no distress.  Awakens easily.    [EH]    Clinical Course User Index [EH] Lorin Glass, PA-C    0:1:50: RE-EVAL: Patient resting comfortably, sxs resolved, no complaints of facial swelling or dyspnea. No appreciable angioedema, lungs CTA, no respiratory distress.  Procedures  MDM    Patient presented to the emergency department status post bee sting with allergic reaction.  He was given epinephrine, prednisone, and Pepcid in the ER.  Benadryl taken prior to arrival. Monitored in the ED. No complaints on re-assessment, remains resting comfortably without regression s/p medications. Appears appropriate for discharge home. Instructions & prescriptions prepared by prior provider. I discussed treatment plan, need for follow-up, and return precautions with the patient. Provided opportunity for questions, patient confirmed understanding and is in agreement with plan.        Leafy Kindle 03/00/92 3300    Delora Fuel, MD 76/22/63 [phone]

## 2019-02-01 NOTE — ED Provider Notes (Signed)
[facility] Provider Note   CSN: [id number] Arrival date & time: 02/01/19  2118    History   Chief Complaint Chief Complaint  Patient presents with   Allergic Reaction   Insect Bite    HPI Lawrence Alexander is a 48 y.o. male with a past medical history of hypertension who presents today for evaluation after a bee sting.  He reports that he has not had a reaction to a bee sting before however at approximately 8 PM he got stung by a bee on his toe.  He states that he has had hives all over, generally itchy.  He denies any shortness of breath or feeling lightheaded, no nausea vomiting or diarrhea.  He does feel like his lips are swollen.  He states that he has never reacted to a bee before or had an anaphylactic reaction before.  He denies any other new exposures.  He took 50 mg of Benadryl p.o. prior to arrival.     HPI  Past Medical History:  Diagnosis Date   Hypertension     There are no active problems to display for this patient.   History reviewed. No pertinent surgical history.      Home Medications    Prior to Admission medications   Medication Sig Start Date End Date Taking? Authorizing Provider  albuterol (PROVENTIL HFA;VENTOLIN HFA) 108 (90 BASE) MCG/ACT inhaler Inhale 1-2 puffs into the lungs every 6 (six) hours as needed for wheezing or shortness of breath. Patient not taking: Reported on 03/13/2018 12/14/14   Morrell RiddleWeber, Sarah L, PA-C  chlorpheniramine-HYDROcodone [facility](TUSSIONEX PENNKINETIC ER) 10-8 MG/5ML SUER Take 5 mLs by mouth at bedtime as needed for cough. Patient not taking: Reported on 03/13/2018 12/14/14   Valarie ConesWeber, Dema SeverinSarah L, PA-C  Diclofenac Sodium CR 100 MG 24 hr tablet Take 1 tablet (100 mg total) by mouth daily. 03/17/18   Kirtland Bouchardlark, Gilbert W, PA-C  EPINEPHrine 0.3 mg/0.3 mL IJ SOAJ injection Inject one device into the side of the thigh for severe allergic reactions.  May repeat with second device in 5-10 minutes if needed.   After using call 9-11. 02/02/19   Cristina GongHammond, Kristjan Derner W, PA-C  predniSONE (DELTASONE) 20 MG tablet Take 2 tablets (40 mg total) by mouth daily for 3 days. 02/02/19 02/05/19  Cristina GongHammond, Lawanda Holzheimer W, PA-C    Family History No family history on file.  Social History Social History   Tobacco Use   Smoking status: Current Every Day Smoker   Smokeless tobacco: Never Used  Substance Use Topics   Alcohol use: Yes    Comment: social   Drug use: No     Allergies   Bee venom   Review of Systems Review of Systems  Constitutional: Negative for chills and fever.  HENT: Negative for trouble swallowing.        Lip swelling  Respiratory: Negative for cough, chest tightness and shortness of breath.   Cardiovascular: Negative for chest pain, palpitations and leg swelling.  Gastrointestinal: Negative for abdominal pain, diarrhea, nausea and vomiting.  Genitourinary: Negative for urgency.  Musculoskeletal: Negative for back pain.  Skin: Positive for color change and rash.  Neurological: Negative for light-headedness.  All other systems reviewed and are negative.    Physical Exam Updated Vital Signs BP 118/77    Pulse 76    Temp 98.4 F (36.9 C) (Oral)    Resp 11    SpO2 95%   Physical Exam Vitals signs and nursing note reviewed.  Constitutional:      General: He is not in acute distress.    Appearance: He is well-developed. He is not diaphoretic.  HENT:     Head: Normocephalic and atraumatic.     Comments: Questionable edema to upper and lower lip.  No obvious intraoral edema, no elevation of the floor the mouth. Eyes:     General: No scleral icterus.       Right eye: No discharge.        Left eye: No discharge.     Conjunctiva/sclera: Conjunctivae normal.  Neck:     Musculoskeletal: Normal range of motion.  Cardiovascular:     Rate and Rhythm: Normal rate and regular rhythm.     Pulses: Normal pulses.     Heart sounds: Normal heart sounds. No murmur.  Pulmonary:     Effort:  Pulmonary effort is normal. No respiratory distress.     Breath sounds: Normal breath sounds. No stridor.  Abdominal:     General: There is no distension.  Musculoskeletal:        General: No deformity.  Skin:    General: Skin is warm and dry.     Comments: There is a diffuse urticarial rash with generalized skin redness.  Patient is constantly scratching.  Left middle toe does not have obvious stinger still embedded.  Neurological:     General: No focal deficit present.     Mental Status: He is alert.     Cranial Nerves: No cranial nerve deficit.     Motor: No abnormal muscle tone.  Psychiatric:        Mood and Affect: Mood normal.        Behavior: Behavior normal.      ED Treatments / Results  Labs (all labs ordered are listed, but only abnormal results are displayed) Labs Reviewed - No data to display  EKG None  Radiology No results found.  Procedures .Critical Care Performed by: Cristina GongHammond, Antionetta Ator W, PA-C Authorized by: Cristina GongHammond, Saquan Furtick W, PA-C   Critical care provider statement:    Critical care time (minutes):  45   Critical care was necessary to treat or prevent imminent or life-threatening deterioration of the following conditions:  Shock   Critical care was time spent personally by me on the following activities:  Evaluation of patient's response to treatment, examination of patient, ordering and performing treatments and interventions, ordering and review of laboratory studies, ordering and review of radiographic studies, pulse oximetry, re-evaluation of patient's condition, obtaining history from patient or surrogate and review of old charts   (including critical care time)  Medications Ordered in ED Medications  EPINEPHrine (EPI-PEN) 0.3 mg/0.3 mL injection (  Given 02/01/19 2133)  methylPREDNISolone sodium succinate (SOLU-MEDROL) 125 mg/2 mL injection 125 mg (125 mg Intravenous Given 02/01/19 2146)  famotidine (PEPCID) IVPB 20 mg premix (0 mg Intravenous  Stopped 02/02/19 0004)     Initial Impression / Assessment and Plan / ED Course  I have reviewed the triage vital signs and the nursing notes.  Pertinent labs & imaging results that were available during my care of the patient were reviewed by me and considered in my medical decision making (see chart for details).  Clinical Course as of Feb 02 19  Wed Feb 01, 2019  2200 Patient has been re-evaluated and reports feeling better   [EH]  Thu Feb 02, 2019  0018 Patient re-evaluated, he is sleeping in no distress.  Awakens easily.    [EH]  Clinical Course User Index [EH] Lorin Glass, PA-C      Patient presents today for evaluation after a bee sting.  On arrival concern for anaphylaxis as he reports lip swelling, in addition to a diffuse urticarial rash.  His lungs are clear to auscultation bilaterally and he is normotensive.  Based on multisystem involvement EpiPen was administered.  He took Benadryl prior to arrival therefore he was not given additional Benadryl, however he was given IV Pepcid and famotidine.  He had rapid improvement in his symptoms after EpiPen.  Plan to observe patient in the emergency room for a minimum of 4 hours from EpiPen.   At shift change care was transferred to [facility] PA-C  who will follow pending studies, re-evaulate and determine disposition.     Final Clinical Impressions(s) / ED Diagnoses   Final diagnoses:  Anaphylaxis, initial encounter  Bee sting reaction, accidental or unintentional, initial encounter    ED Discharge Orders         Ordered    predniSONE (DELTASONE) 20 MG tablet  Daily     02/02/19 0018    EPINEPHrine 0.3 mg/0.3 mL IJ SOAJ injection     02/02/19 0018           Lorin Glass, PA-C 02/02/19 0020    Deno Etienne, DO 02/02/19 1529

## 2019-02-01 NOTE — ED Notes (Signed)
Pt states he took benadryl just PTA

## 2019-02-01 NOTE — ED Notes (Signed)
ED Provider at bedside. 

## 2019-02-02 MED ORDER — EPINEPHRINE 0.3 MG/0.3ML IJ SOAJ: INTRAMUSCULAR | 1 refills | Status: AC

## 2019-02-02 MED ORDER — PREDNISONE 20 MG PO TABS: 40.0000 mg | ORAL_TABLET | Freq: Every day | ORAL | 0 refills | Status: AC

## 2019-02-02 NOTE — Discharge Instructions (Addendum)
I recommend that you get your epi pen rx filled tonight.  There is a [facility] hour [facility] and [facility] north of [place].    Today you received medications that may make you sleepy or impair your ability to make decisions.  For the next 24 hours please do not drive, operate heavy machinery, care for a small child with out another adult present, or perform any activities that may cause harm to you or someone else if you were to fall asleep or be impaired.   You are being prescribed a medication which may make you sleepy. Please follow up of listed precautions for at least 24 hours after taking one dose.  Today you were seen and evaluated for an allergic reaction.  You were given multiple medications in the emergency room including IV steroids.    If you develop any concerning symptoms, especially feeling like your throat is closing, shortness of breath, swelling of the face, lips or tongue then please return to the emergency room immediately for evaluation.    For the next 24 hours please take 1 pill (25 mg) of benadryl every 6 hours.  If you have mild symptoms such as itching, mild nausea you may take a second benadryl. This will make you sleepy and you should not drive, operate heavy machinery or perform any task where if you were to fall asleep or make a slow decision it could cause harm to you or someone else.   You have also been given a prescription for 3 days of steroids.  Please take your first dose about 24 hours after you were given steroids in the emergency room.  Steroids may give you the feeling of being hot, increased appetite, and extra energy.    I have given you a prescription for two epi pens.  Please make sure you keep one with you at all times.  Please consider keeping two pills of benadryl with your epi pen and if you use it take the benadryl and call 9-11.  Please do not store epi-pens in your car or allow them to get very hot or cold as this can make them not work as well.

## 2019-02-02 NOTE — ED Notes (Signed)
All appropriate discharge materials reviewed with patient at length. Time for questions provided. Pt denies any further questions at this time. Verbalizes understanding of all provided materials.  

## 2020-10-21 ENCOUNTER — Emergency Department (HOSPITAL_COMMUNITY): Payer: No Typology Code available for payment source

## 2020-10-21 ENCOUNTER — Other Ambulatory Visit: Payer: Self-pay

## 2020-10-21 ENCOUNTER — Emergency Department (HOSPITAL_COMMUNITY)
Admission: EM | Admit: 2020-10-21 | Discharge: 2020-10-21 | Disposition: A | Discharge status: Home / Self Care | Payer: No Typology Code available for payment source | Attending: Emergency Medicine | Admitting: Emergency Medicine

## 2020-10-21 DIAGNOSIS — F17200 Nicotine dependence, unspecified, uncomplicated: Secondary | ICD-10-CM | POA: Insufficient documentation

## 2020-10-21 DIAGNOSIS — S6992XA Unspecified injury of left wrist, hand and finger(s), initial encounter: Secondary | ICD-10-CM | POA: Diagnosis present

## 2020-10-21 DIAGNOSIS — M25512 Pain in left shoulder: Secondary | ICD-10-CM | POA: Diagnosis not present

## 2020-10-21 DIAGNOSIS — Z23 Encounter for immunization: Secondary | ICD-10-CM | POA: Insufficient documentation

## 2020-10-21 DIAGNOSIS — I10 Essential (primary) hypertension: Secondary | ICD-10-CM | POA: Insufficient documentation

## 2020-10-21 DIAGNOSIS — Y92410 Unspecified street and highway as the place of occurrence of the external cause: Secondary | ICD-10-CM | POA: Diagnosis not present

## 2020-10-21 DIAGNOSIS — M25561 Pain in right knee: Secondary | ICD-10-CM

## 2020-10-21 DIAGNOSIS — S80211A Abrasion, right knee, initial encounter: Secondary | ICD-10-CM | POA: Diagnosis not present

## 2020-10-21 DIAGNOSIS — S60512A Abrasion of left hand, initial encounter: Secondary | ICD-10-CM | POA: Diagnosis not present

## 2020-10-21 MED ORDER — NAPROXEN 500 MG PO TABS: 500.0000 mg | ORAL_TABLET | Freq: Two times a day (BID) | ORAL | 0 refills | Status: AC

## 2020-10-21 MED ORDER — TETANUS-DIPHTH-ACELL PERTUSSIS 5-2.5-18.5 LF-MCG/0.5 IM SUSY
0.5000 mL | PREFILLED_SYRINGE | Freq: Once | INTRAMUSCULAR | Status: AC
  Administered 2020-10-21: 0.5 mL via INTRAMUSCULAR
  Filled 2020-10-21: qty 0.5

## 2020-10-21 MED ORDER — METHOCARBAMOL 500 MG PO TABS: 500.0000 mg | ORAL_TABLET | Freq: Two times a day (BID) | ORAL | 0 refills | Status: AC
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
## 2020-10-21 NOTE — Discharge Instructions (Signed)
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
## 2021-06-24 ENCOUNTER — Other Ambulatory Visit: Payer: Self-pay

## 2021-06-24 ENCOUNTER — Emergency Department (HOSPITAL_BASED_OUTPATIENT_CLINIC_OR_DEPARTMENT_OTHER)
Admission: EM | Admit: 2021-06-24 | Discharge: 2021-06-24 | Disposition: A | Discharge status: Home / Self Care | Payer: Self-pay | Attending: Emergency Medicine | Admitting: Emergency Medicine

## 2021-06-24 ENCOUNTER — Encounter (HOSPITAL_BASED_OUTPATIENT_CLINIC_OR_DEPARTMENT_OTHER): Payer: Self-pay | Admitting: Emergency Medicine

## 2021-06-24 DIAGNOSIS — K649 Unspecified hemorrhoids: Secondary | ICD-10-CM

## 2021-06-24 DIAGNOSIS — F17200 Nicotine dependence, unspecified, uncomplicated: Secondary | ICD-10-CM | POA: Insufficient documentation

## 2021-06-24 DIAGNOSIS — K5900 Constipation, unspecified: Secondary | ICD-10-CM | POA: Insufficient documentation

## 2021-06-24 DIAGNOSIS — I10 Essential (primary) hypertension: Secondary | ICD-10-CM | POA: Insufficient documentation

## 2021-06-24 DIAGNOSIS — K644 Residual hemorrhoidal skin tags: Secondary | ICD-10-CM | POA: Insufficient documentation

## 2021-06-24 MED ORDER — POLYETHYLENE GLYCOL 3350 17 GM/SCOOP PO POWD: 1.0000 | Freq: Once | ORAL | 0 refills | Status: AC

## 2021-06-24 MED ORDER — LIDOCAINE 4 % EX CREA
TOPICAL_CREAM | Freq: Once | CUTANEOUS | Status: AC
  Administered 2021-06-24: 1 via TOPICAL
  Filled 2021-06-24: qty 5

## 2021-06-24 MED ORDER — POLYETHYLENE GLYCOL 3350 17 GM/SCOOP PO POWD: 17.0000 g | Freq: Every day | ORAL | 0 refills | Status: DC

## 2021-06-24 MED ORDER — HYDROCORT-PRAMOXINE (PERIANAL) 1-1 % EX FOAM: 1.0000 | Freq: Two times a day (BID) | CUTANEOUS | 0 refills | Status: AC

## 2021-06-24 NOTE — Discharge Instructions (Addendum)
Please follow up with general surgery regarding hemorrhoids  Pour entire contents of miralax container into 64 oz of liquid of your choice, drink a few sips every 5 minutes until you have bowel movement

## 2021-06-24 NOTE — ED Provider Notes (Signed)
[facility] EMERGENCY DEPT Provider Note   CSN: [id number] Arrival date & time: 06/24/21  1602     History Chief Complaint  Patient presents with   Rectal Bleeding    Lawrence Alexander is a 50 y.o. male.  This is a 50 y.o. male with significant medical history as below, including htn who presents to the ED with complaint of constipation, pain with defecation, blood per rectum when wiping.  Patient reports he has been having problems with hemorrhoids over the past 6 months, he was using Preparation H and wet wipes.  Hemorrhoid resolved.  Patient reports over the past 3 days has been having similar discomfort.  He was told to follow-up with gastroenterology but never did.  He has been taking Metamucil for symptomatic control which has not improved his constipation or rectal pain.  No blood thinners, no history of rectal trauma, no abdominal pain, no nausea or vomiting.  No pain to penis or scrotum.  No suspicious oral intake    The history is provided by the patient. No language interpreter was used.  Rectal Bleeding Associated symptoms: no abdominal pain, no fever and no vomiting       Past Medical History:  Diagnosis Date   Hypertension     There are no problems to display for this patient.   History reviewed. No pertinent surgical history.     History reviewed. No pertinent family history.  Social History   Tobacco Use   Smoking status: Every Day   Smokeless tobacco: Never  Substance Use Topics   Alcohol use: Yes    Comment: social   Drug use: No    Home Medications Prior to Admission medications   Medication Sig Start Date End Date Taking? Authorizing Provider  hydrocortisone-pramoxine [facility]) rectal foam Place 1 applicator rectally 2 (two) times daily for 7 days. 06/24/21 07/01/21 Yes Tanda Rockers A, DO  albuterol (PROVENTIL HFA;VENTOLIN HFA) 108 (90 BASE) MCG/ACT inhaler Inhale 1-2 puffs into the lungs every 6 (six) hours as needed for  wheezing or shortness of breath. Patient not taking: No sig reported 12/14/14   Valarie Cones, Dema Severin, PA-C  chlorpheniramine-HYDROcodone [facility]) 10-8 MG/5ML SUER Take 5 mLs by mouth at bedtime as needed for cough. Patient not taking: Reported on 10/21/2020 12/14/14   Morrell Riddle, PA-C  Diclofenac Sodium CR 100 MG 24 hr tablet Take 1 tablet (100 mg total) by mouth daily. Patient not taking: Reported on 10/21/2020 03/17/18   Kirtland Bouchard, PA-C  EPINEPHrine 0.3 mg/0.3 mL IJ SOAJ injection Inject one device into the side of the thigh for severe allergic reactions.  May repeat with second device in 5-10 minutes if needed.  After using call 9-11. Patient taking differently: Inject 0.3 mg into the muscle See admin instructions. Inject 0.3 mg into the muscle of the side of a thigh for severe allergic reactions- may repeat with a 2nd injection in 5-10 minutes, if needed. CALL 9-1-1 after using. 02/02/19   Cristina Gong, PA-C  methocarbamol (ROBAXIN) 500 MG tablet Take 1 tablet (500 mg total) by mouth 2 (two) times daily. 10/21/20   Dartha Lodge, PA-C  naproxen (NAPROSYN) 500 MG tablet Take 1 tablet (500 mg total) by mouth 2 (two) times daily. 10/21/20   Dartha Lodge, PA-C    Allergies    Bee venom  Review of Systems   Review of Systems  Constitutional:  Negative for chills and fever.  HENT:  Negative for facial swelling  and trouble swallowing.   Eyes:  Negative for photophobia and visual disturbance.  Respiratory:  Negative for cough and shortness of breath.   Cardiovascular:  Negative for chest pain and palpitations.  Gastrointestinal:  Positive for anal bleeding, constipation and hematochezia. Negative for abdominal pain, nausea and vomiting.  Endocrine: Negative for polydipsia and polyuria.  Genitourinary:  Negative for difficulty urinating and hematuria.  Musculoskeletal:  Negative for gait problem and joint swelling.  Skin:  Negative for pallor and rash.  Neurological:   Negative for syncope and headaches.  Psychiatric/Behavioral:  Negative for agitation and confusion.    Physical Exam Updated Vital Signs BP 133/83 (BP Location: Right Arm)    Pulse 96    Temp 98.3 F (36.8 C) (Oral)    Resp 20    Ht 5\' 7"  (1.702 m)    Wt 106.6 kg    SpO2 98%    BMI 36.81 kg/m   Physical Exam Vitals and nursing note reviewed. Exam conducted with a chaperone present.  Constitutional:      General: He is not in acute distress.    Appearance: He is well-developed.  HENT:     Head: Normocephalic and atraumatic.     Right Ear: External ear normal.     Left Ear: External ear normal.     Mouth/Throat:     Mouth: Mucous membranes are moist.  Eyes:     General: No scleral icterus. Cardiovascular:     Rate and Rhythm: Normal rate and regular rhythm.     Pulses: Normal pulses.     Heart sounds: Normal heart sounds.  Pulmonary:     Effort: Pulmonary effort is normal. No respiratory distress.     Breath sounds: Normal breath sounds.  Abdominal:     General: Abdomen is flat.     Palpations: Abdomen is soft.     Tenderness: There is no abdominal tenderness.  Genitourinary:    Comments: Small external hemorrhoid, not thrombosed, no frank bleeding appreciated Musculoskeletal:        General: Normal range of motion.     Cervical back: Normal range of motion.     Right lower leg: No edema.     Left lower leg: No edema.  Skin:    General: Skin is warm and dry.     Capillary Refill: Capillary refill takes less than 2 seconds.  Neurological:     Mental Status: He is alert and oriented to person, place, and time.  Psychiatric:        Mood and Affect: Mood normal.        Behavior: Behavior normal.    ED Results / Procedures / Treatments   Labs (all labs ordered are listed, but only abnormal results are displayed) Labs Reviewed - No data to display  EKG None  Radiology No results found.  Procedures Procedures   Medications Ordered in ED Medications  lidocaine  (LMX) 4 % cream (1 application Topical Given 06/24/21 1927)    ED Course  I have reviewed the triage vital signs and the nursing notes.  Pertinent labs & imaging results that were available during my care of the patient were reviewed by me and considered in my medical decision making (see chart for details).    MDM Rules/Calculators/A&P                          CC: constipation, blood on toilet paper while wiping.  This patient complains of  above; this involves an extensive number of treatment options and is a complaint that carries with it a high risk of complications and morbidity. Vital signs were reviewed. Serious etiologies considered.  Record review:  Previous records obtained and reviewed   Additional history obtained from sister  Management: Given topical lidocaine  No abdominal pain, abdomen is soft, nontender.  No change oral intake.  No nausea or vomiting.  Pt with external hemorrhoid on exam, likely source of blood noted in toilet paper. Discussed home care regarding hemorrhoids. Recommend o/p f/u with general surgery clinic for evaluation.    The patient improved significantly and was discharged in stable condition. Detailed discussions were had with the patient regarding current findings, and need for close f/u with PCP or on call doctor. The patient has been instructed to return immediately if the symptoms worsen in any way for re-evaluation. Patient verbalized understanding and is in agreement with current care plan. All questions answered prior to discharge.           This chart was dictated using voice recognition software.  Despite best efforts to proofread,  errors can occur which can change the documentation meaning.    Final Clinical Impression(s) / ED Diagnoses Final diagnoses:  Constipation, unspecified constipation type  Hemorrhoids, unspecified hemorrhoid type    Rx / DC Orders ED Discharge Orders          Ordered     hydrocortisone-pramoxine (PROCTOFOAM-HC) rectal foam  2 times daily        06/24/21 1924    polyethylene glycol powder (GLYCOLAX/MIRALAX) 17 GM/SCOOP powder  Daily,   Status:  Discontinued        06/24/21 1924    polyethylene glycol powder (GLYCOLAX/MIRALAX) 17 GM/SCOOP powder   Once        06/24/21 [place], Adaliz Dobis A, DO 06/25/21 0001

## 2021-06-24 NOTE — ED Triage Notes (Addendum)
Intermittent rectal bright red bleeding x 2 months , no Hx hemorrhoid, difficulty pass bowel movements , rectal pain.

## 2021-06-24 NOTE — ED Notes (Signed)
At beside w/ Dr. Wallace Cullens for exam as a chaperone.

## 2021-06-25 ENCOUNTER — Encounter (HOSPITAL_BASED_OUTPATIENT_CLINIC_OR_DEPARTMENT_OTHER): Payer: Self-pay | Admitting: Emergency Medicine

## 2021-08-25 ENCOUNTER — Other Ambulatory Visit: Payer: Self-pay

## 2021-08-25 ENCOUNTER — Emergency Department (HOSPITAL_COMMUNITY): Payer: Self-pay

## 2021-08-25 ENCOUNTER — Emergency Department (HOSPITAL_COMMUNITY)
Admission: EM | Admit: 2021-08-25 | Discharge: 2021-08-26 | Disposition: A | Discharge status: Home / Self Care | Payer: Self-pay | Attending: Emergency Medicine | Admitting: Emergency Medicine

## 2021-08-25 ENCOUNTER — Encounter (HOSPITAL_COMMUNITY): Payer: Self-pay

## 2021-08-25 DIAGNOSIS — B349 Viral infection, unspecified: Secondary | ICD-10-CM | POA: Insufficient documentation

## 2021-08-25 DIAGNOSIS — J00 Acute nasopharyngitis [common cold]: Secondary | ICD-10-CM | POA: Insufficient documentation

## 2021-08-25 DIAGNOSIS — I10 Essential (primary) hypertension: Secondary | ICD-10-CM | POA: Insufficient documentation

## 2021-08-25 DIAGNOSIS — Z20822 Contact with and (suspected) exposure to covid-19: Secondary | ICD-10-CM | POA: Insufficient documentation

## 2021-08-25 LAB — RESP PANEL BY RT-PCR (FLU A&B, COVID) ARPGX2
Influenza A by PCR: NEGATIVE
Influenza B by PCR: NEGATIVE
SARS Coronavirus 2 by RT PCR: NEGATIVE

## 2021-08-25 LAB — GROUP A STREP BY PCR: Group A Strep by PCR: NOT DETECTED

## 2021-08-25 MED ORDER — ACETAMINOPHEN 325 MG PO TABS
650.0000 mg | ORAL_TABLET | Freq: Once | ORAL | Status: AC
  Administered 2021-08-25: 21:00:00 650 mg via ORAL
  Filled 2021-08-25: qty 2

## 2021-08-25 NOTE — ED Triage Notes (Signed)
Pt reports with SHOB, cough, chills, and generalized body aches x 2 days. Pt has HTN but does not take his medication.

## 2021-08-25 NOTE — ED Provider Triage Note (Signed)
Emergency Medicine Provider Triage Evaluation Note  Lawrence Alexander , a 51 y.o. male  was evaluated in triage.  Pt complains of sore throat, body aches and fevers.  He has been feeling poor for two days.  He reports mild shortness of breath but mostly attributes that to wearing a mask.  He does report occasional cough. He states he has not taken any of his antihypertensives in 10 to 15 years.    Physical Exam  BP (!) 179/96 (BP Location: Left Arm)    Pulse 95    Temp (!) 100.6 F (38.1 C) (Oral)    Resp 18    Ht 5\' 7"  (1.702 m)    Wt 88.9 kg    SpO2 99%    BMI 30.70 kg/m  Gen:   Awake, no distress   Resp:  Normal effort  MSK:   Moves extremities without difficulty  Other:  Normal speech.   Medical Decision Making  Medically screening exam initiated at 8:35 PM.  Appropriate orders placed.  was informed that the remainder of the evaluation will be completed by another provider, this initial triage assessment does not replace that evaluation, and the importance of remaining in the ED until their evaluation is complete.     Lyndee Leo, Cristina Gong 08/25/21 2038

## 2021-08-26 MED ORDER — IBUPROFEN 800 MG PO TABS
800.0000 mg | ORAL_TABLET | Freq: Once | ORAL | Status: AC
  Administered 2021-08-26: 800 mg via ORAL
  Filled 2021-08-26: qty 1

## 2021-08-26 NOTE — ED Provider Notes (Signed)
[facility]-EMERGENCY DEPT Provider Note  CSN: [id number] Arrival date & time: 08/25/21 1839  Chief Complaint(s) Sore Throat, Generalized Body Aches, and Chills  HPI Lawrence Alexander is a 51 y.o. male here with 2 days of subjective fevers, sore throat, coughing and congestion.  No nausea or vomiting.  No chest pain.  No abdominal pain.  No diarrhea.  No known sick contacts.   Sore Throat   Past Medical History Past Medical History:  Diagnosis Date   Hypertension    There are no problems to display for this patient.  Home Medication(s) Prior to Admission medications   Medication Sig Start Date End Date Taking? Authorizing Provider  albuterol (PROVENTIL HFA;VENTOLIN HFA) 108 (90 BASE) MCG/ACT inhaler Inhale 1-2 puffs into the lungs every 6 (six) hours as needed for wheezing or shortness of breath. Patient not taking: No sig reported 12/14/14   Valarie Cones, Dema Severin, PA-C  chlorpheniramine-HYDROcodone [facility]) 10-8 MG/5ML SUER Take 5 mLs by mouth at bedtime as needed for cough. Patient not taking: Reported on 10/21/2020 12/14/14   Morrell Riddle, PA-C  Diclofenac Sodium CR 100 MG 24 hr tablet Take 1 tablet (100 mg total) by mouth daily. Patient not taking: Reported on 10/21/2020 03/17/18   Kirtland Bouchard, PA-C  EPINEPHrine 0.3 mg/0.3 mL IJ SOAJ injection Inject one device into the side of the thigh for severe allergic reactions.  May repeat with second device in 5-10 minutes if needed.  After using call 9-11. Patient taking differently: Inject 0.3 mg into the muscle See admin instructions. Inject 0.3 mg into the muscle of the side of a thigh for severe allergic reactions- may repeat with a 2nd injection in 5-10 minutes, if needed. CALL 9-1-1 after using. 02/02/19   Cristina Gong, PA-C  methocarbamol (ROBAXIN) 500 MG tablet Take 1 tablet (500 mg total) by mouth 2 (two) times daily. 10/21/20   Dartha Lodge, PA-C  naproxen (NAPROSYN) 500 MG tablet Take 1  tablet (500 mg total) by mouth 2 (two) times daily. 10/21/20   Dartha Lodge, PA-C                                                                                                                                    Allergies Bee venom  Review of Systems Review of Systems As noted in HPI  Physical Exam Vital Signs  I have reviewed the triage vital signs BP (!) 154/94    Pulse 94    Temp 99.3 F (37.4 C)    Resp 16    Ht 5\' 7"  (1.702 m)    Wt 88.9 kg    SpO2 99%    BMI 30.70 kg/m   Physical Exam Vitals reviewed.  Constitutional:      General: He is not in acute distress.    Appearance: He is well-developed. He is not diaphoretic.  HENT:  Head: Normocephalic and atraumatic.     Nose: Nose normal.     Mouth/Throat:     Tongue: No lesions.     Pharynx: No pharyngeal swelling or posterior oropharyngeal erythema.     Tonsils: No tonsillar exudate.  Eyes:     General: No scleral icterus.       Right eye: No discharge.        Left eye: No discharge.     Conjunctiva/sclera: Conjunctivae normal.     Pupils: Pupils are equal, round, and reactive to light.  Cardiovascular:     Rate and Rhythm: Normal rate and regular rhythm.     Heart sounds: No murmur heard.   No friction rub. No gallop.  Pulmonary:     Effort: Pulmonary effort is normal. No respiratory distress.     Breath sounds: Normal breath sounds. No stridor. No rales.  Abdominal:     General: There is no distension.     Palpations: Abdomen is soft.     Tenderness: There is no abdominal tenderness.  Musculoskeletal:        General: No tenderness.     Cervical back: Normal range of motion and neck supple.  Skin:    General: Skin is warm and dry.     Findings: No erythema or rash.  Neurological:     Mental Status: He is alert and oriented to person, place, and time.    ED Results and Treatments Labs (all labs ordered are listed, but only abnormal results are displayed) Labs Reviewed  RESP PANEL BY RT-PCR (FLU A&B,  COVID) ARPGX2  GROUP A STREP BY PCR                                                                                                                         EKG  EKG Interpretation  Date/Time:  Monday August 25 2021 20:45:26 EST Ventricular Rate:  94 PR Interval:  156 QRS Duration: 89 QT Interval:  324 QTC Calculation: 406 R Axis:   -67 Text Interpretation: Sinus rhythm Left anterior fascicular block Abnormal R-wave progression, early transition ST elevation, likely benign early repolarization Confirmed by Ernie Avena ([id number]) on 08/25/2021 8:48:07 PM       Radiology DG Chest 2 View  Result Date: 08/25/2021 CLINICAL DATA:  Shortness of breath EXAM: CHEST - 2 VIEW COMPARISON:  03/13/2018 FINDINGS: The heart size and mediastinal contours are within normal limits. Both lungs are clear. The visualized skeletal structures are unremarkable. IMPRESSION: Normal study. Electronically Signed   By: Charlett Nose M.D.   On: 08/25/2021 21:00    Pertinent labs & imaging results that were available during my care of the patient were reviewed by me and considered in my medical decision making (see MDM for details).  Medications Ordered in ED Medications  acetaminophen (TYLENOL) tablet 650 mg (650 mg Oral Given 08/25/21 2034)  ibuprofen (ADVIL) tablet 800 mg (800 mg Oral Given 08/26/21 0059)  Procedures Procedures  (including critical care time)  Medical Decision Making / ED Course        Patient presents with viral symptoms for 2 days. adequate oral hydration. Rest of history as above.  Patient appears well. No signs of toxicity, patient is interactive. No hypoxia, tachypnea or other signs of respiratory distress. No sign of clinical dehydration. Lung exam clear. Rest of exam as above    Work-up ordered to assess concerns above.  Swabs and x-ray independently  interpreted by me and noted below: Negative for COVID/influenza and strep throat Chest x-ray without evidence of pneumonia  Most consistent with viral illness   No evidence suggestive of pharyngitis, AOM, PNA, or meningitis.   Discussed symptomatic treatment with the patient and they will follow closely with their PCP.        Final Clinical Impression(s) / ED Diagnoses Final diagnoses:  Acute nasopharyngitis  Viral illness   The patient appears reasonably screened and/or stabilized for discharge and I doubt any other medical condition or other [facility] requiring further screening, evaluation, or treatment in the ED at this time prior to discharge. Safe for discharge with strict return precautions.  Disposition: Discharge  Condition: Good  I have discussed the results, Dx and Tx plan with the patient/family who expressed understanding and agree(s) with the plan. Discharge instructions discussed at length. The patient/family was given strict return precautions who verbalized understanding of the instructions. No further questions at time of discharge.    ED Discharge Orders     None       Follow Up: Primary care provider  Call  if you do not have a primary care physician, contact [facility] at [phone] for referral           This chart was dictated using voice recognition software.  Despite best efforts to proofread,  errors can occur which can change the documentation meaning.    Nira Conn, MD 08/26/21 [phone]

## 2021-08-26 NOTE — Discharge Instructions (Signed)
You may take over-the-counter medicine for symptomatic relief, such as Tylenol, Motrin, Corcidin HBP, black elderberry, etc. Please limit acetaminophen (Tylenol) to 4000 mg and Ibuprofen (Motrin, Advil, etc.) to 2400 mg for a 24hr period. Please note that other over-the-counter medicine may contain acetaminophen or ibuprofen as a component of their ingredients.

## 2022-03-17 IMAGING — CR DG CHEST 2V
2 series · 2 of 2 positions shown · non-contrast
Comparison: 03/13/2018

CLINICAL DATA: Shortness of breath

EXAM:
CHEST - 2 VIEW

[w chest pa]
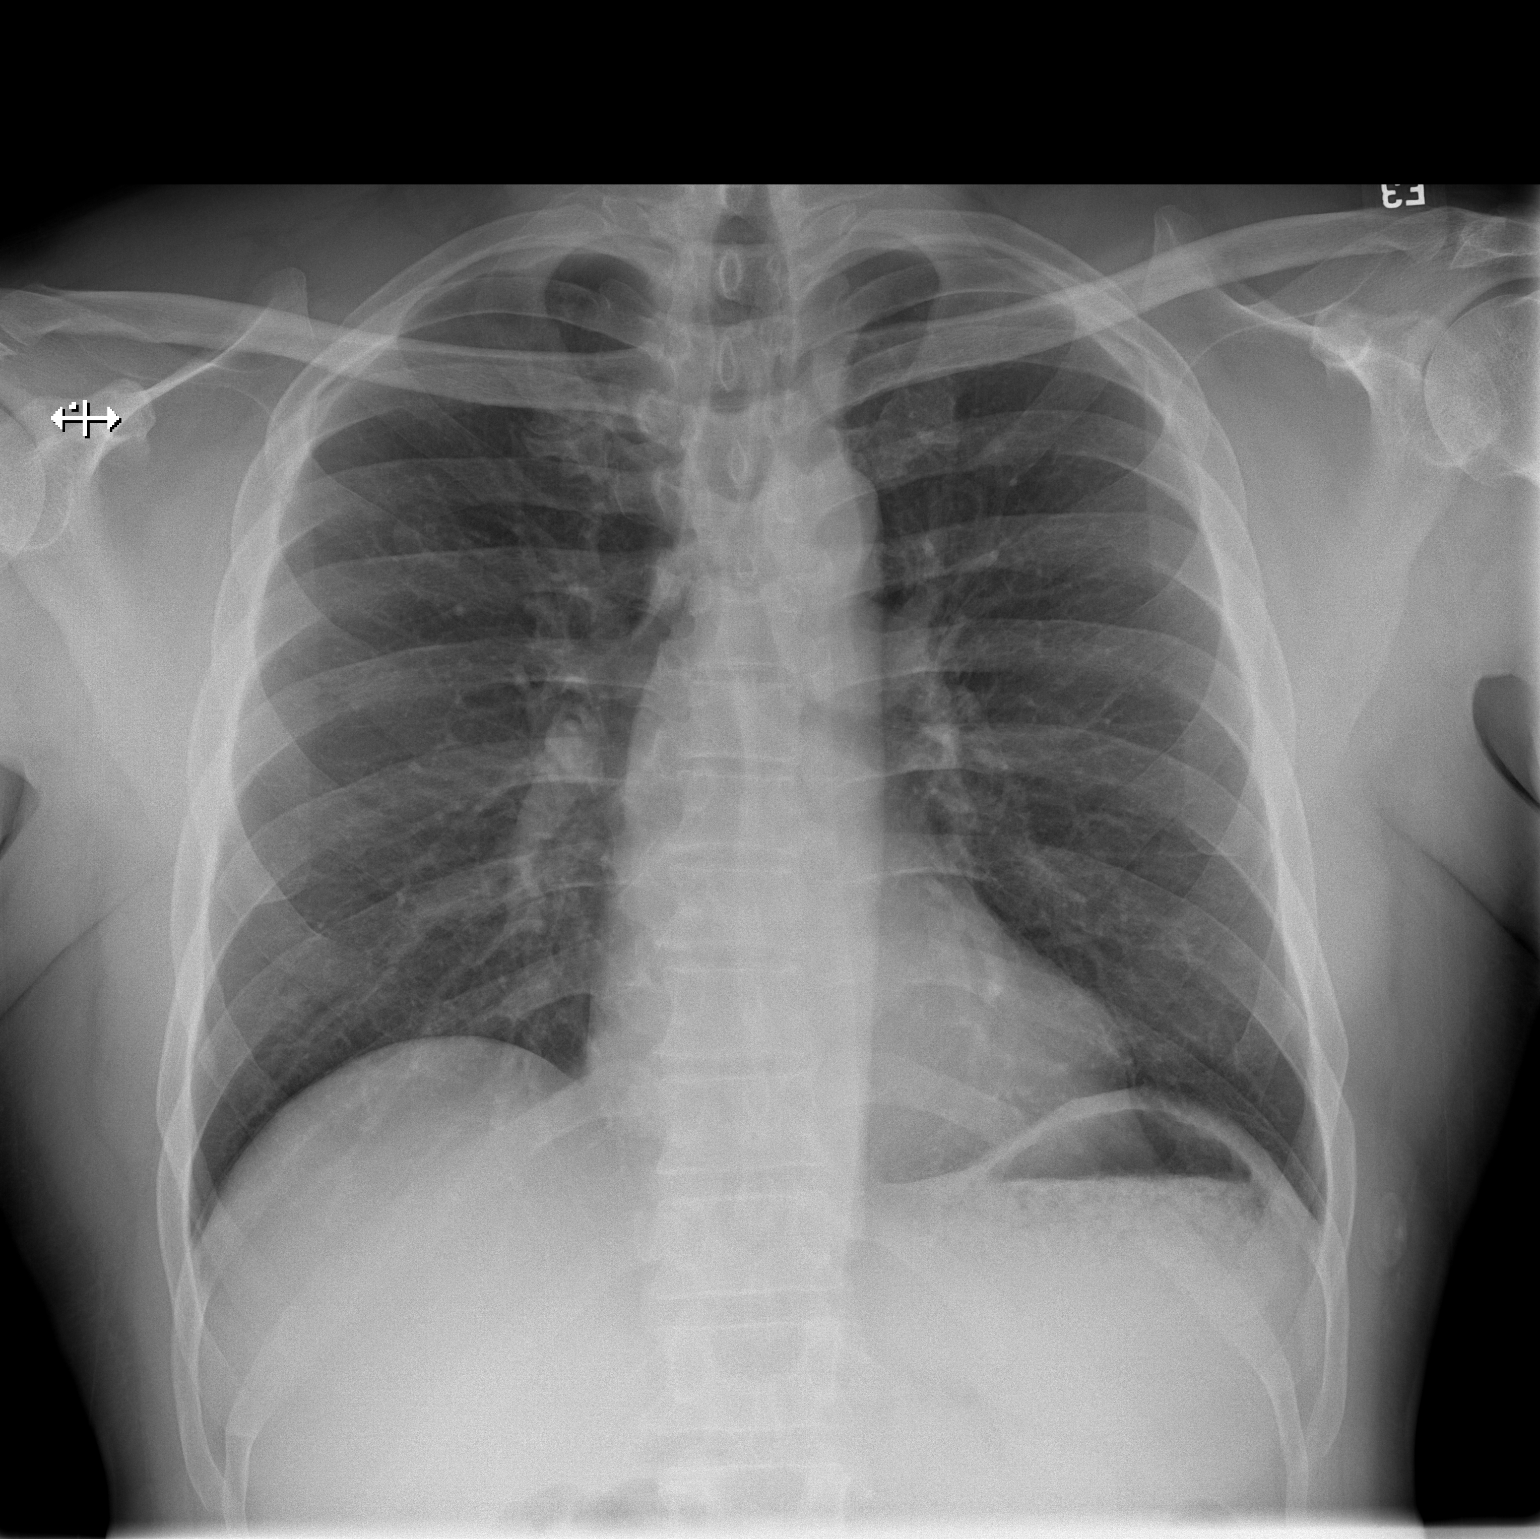

[w chest lat]
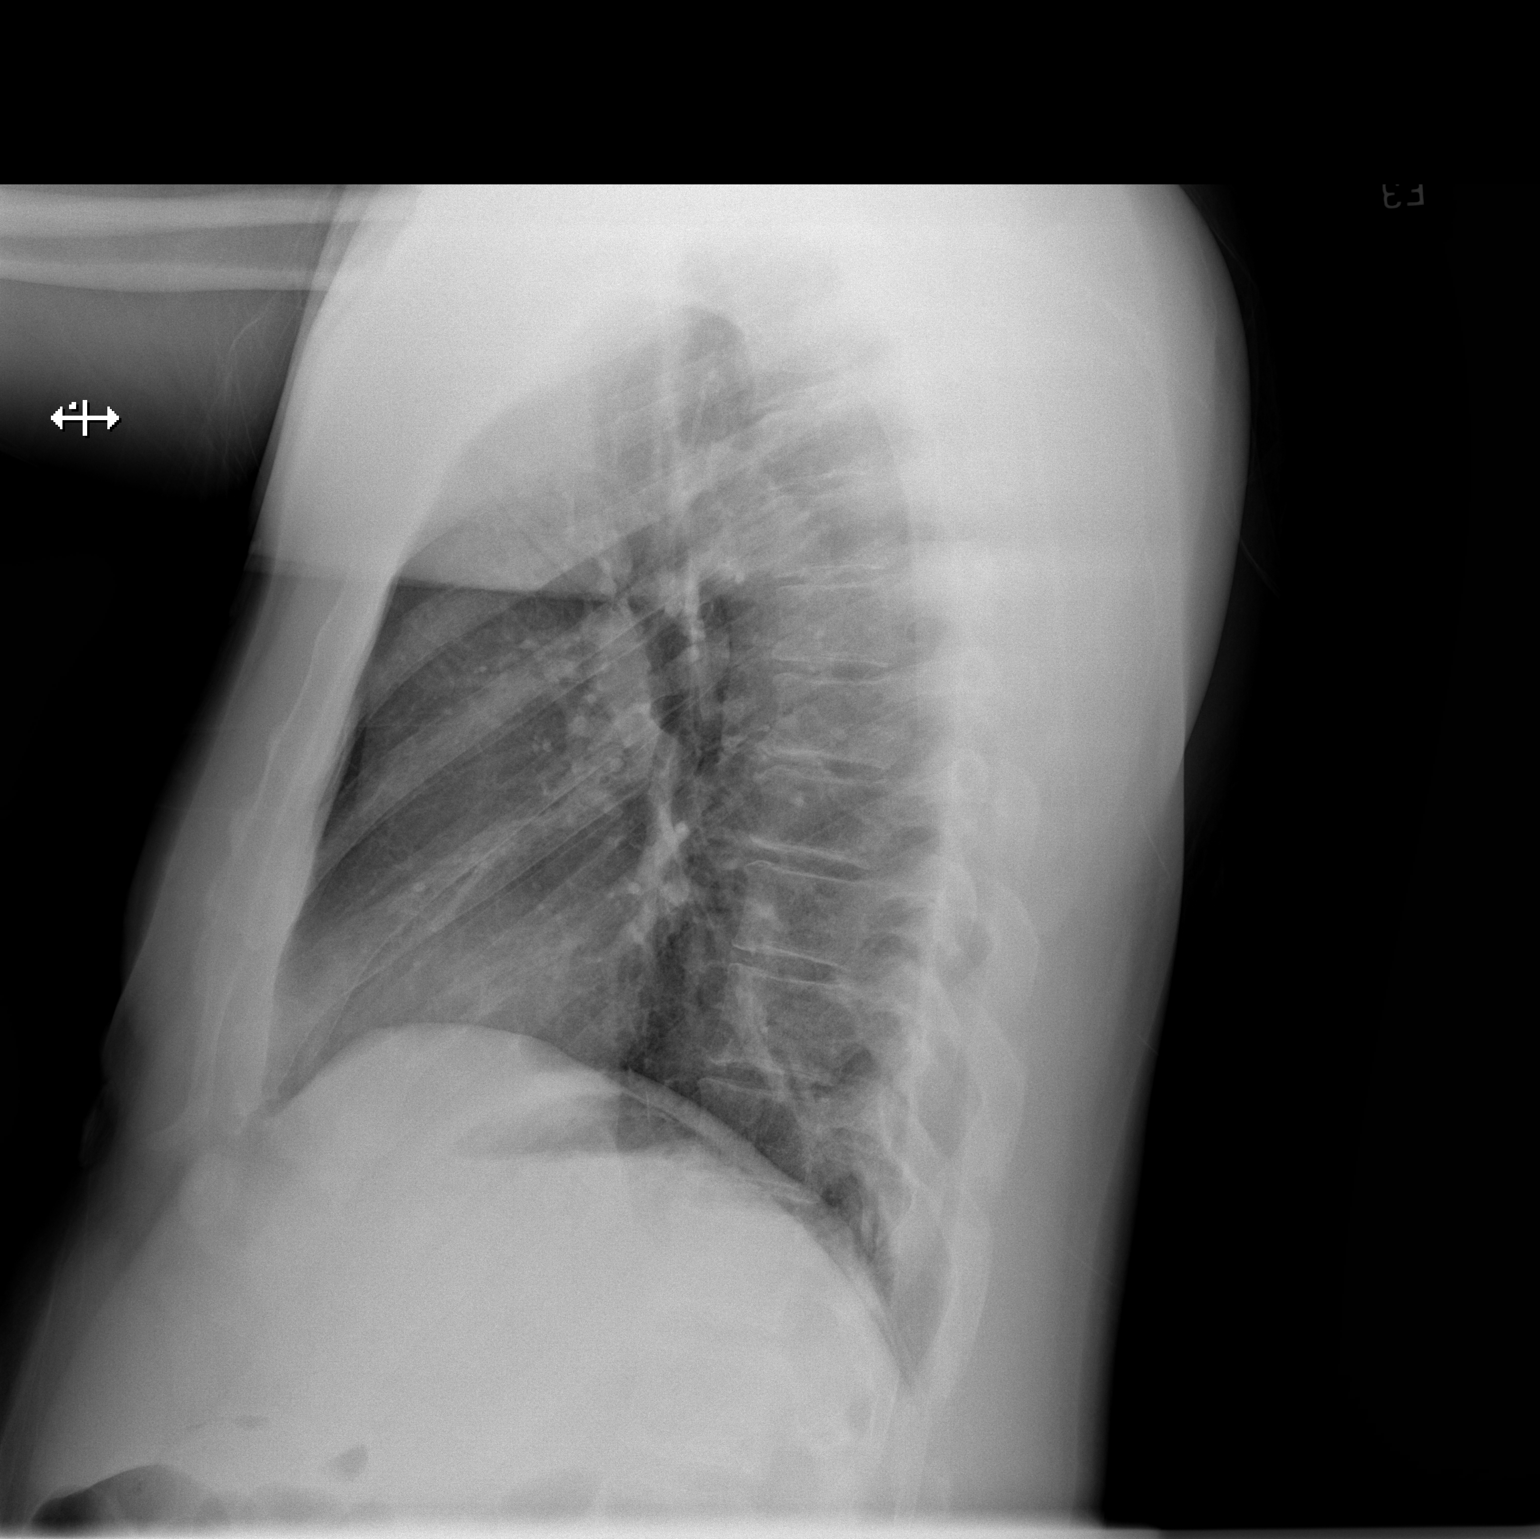

[2 of 2 positions shown; findings below may reference images not displayed]

FINDINGS: The heart size and mediastinal contours are within normal limits.
Both lungs are clear. The visualized skeletal structures are
unremarkable.
IMPRESSION: Normal study.

## 2022-04-24 ENCOUNTER — Other Ambulatory Visit: Payer: Self-pay | Admitting: Physician Assistant

## 2022-04-24 DIAGNOSIS — Z122 Encounter for screening for malignant neoplasm of respiratory organs: Secondary | ICD-10-CM

## 2022-06-04 ENCOUNTER — Inpatient Hospital Stay: Admission: RE | Admit: 2022-06-04 | Payer: Self-pay | Source: Ambulatory Visit

## 2022-06-11 ENCOUNTER — Encounter (HOSPITAL_COMMUNITY): Payer: Self-pay

## 2022-06-11 ENCOUNTER — Emergency Department (HOSPITAL_COMMUNITY)
Admission: EM | Admit: 2022-06-11 | Discharge: 2022-06-12 | Disposition: A | Discharge status: Home / Self Care | Payer: Commercial Managed Care - HMO | Attending: Emergency Medicine | Admitting: Emergency Medicine

## 2022-06-11 ENCOUNTER — Emergency Department (HOSPITAL_COMMUNITY): Payer: Commercial Managed Care - HMO

## 2022-06-11 DIAGNOSIS — J069 Acute upper respiratory infection, unspecified: Secondary | ICD-10-CM | POA: Diagnosis not present

## 2022-06-11 DIAGNOSIS — B9789 Other viral agents as the cause of diseases classified elsewhere: Secondary | ICD-10-CM | POA: Diagnosis not present

## 2022-06-11 DIAGNOSIS — Z20822 Contact with and (suspected) exposure to covid-19: Secondary | ICD-10-CM | POA: Diagnosis not present

## 2022-06-11 DIAGNOSIS — R944 Abnormal results of kidney function studies: Secondary | ICD-10-CM | POA: Insufficient documentation

## 2022-06-11 DIAGNOSIS — R059 Cough, unspecified: Secondary | ICD-10-CM | POA: Diagnosis present

## 2022-06-11 LAB — BASIC METABOLIC PANEL
Anion gap: 7 (ref 5–15)
BUN: 13 mg/dL (ref 6–20)
CO2: 26 mmol/L (ref 22–32)
Calcium: 8.8 mg/dL — ABNORMAL LOW (ref 8.9–10.3)
Chloride: 102 mmol/L (ref 98–111)
Creatinine, Ser: 1.33 mg/dL — ABNORMAL HIGH (ref 0.61–1.24)
GFR, Estimated: >60 mL/min (ref >60)
Glucose, Bld: 100 mg/dL — ABNORMAL HIGH (ref 70–99)
Potassium: 4.2 mmol/L (ref 3.5–5.1)
Sodium: 135 mmol/L (ref 135–145)

## 2022-06-11 LAB — CBC WITH DIFFERENTIAL/PLATELET
Abs Immature Granulocytes: 0.04 10*3/uL (ref 0.00–0.07)
Basophils Absolute: 0.1 10*3/uL (ref 0.0–0.1)
Basophils Relative: 1 %
Eosinophils Absolute: 0 10*3/uL (ref 0.0–0.5)
Eosinophils Relative: 0 %
HCT: 45.3 % (ref 39.0–52.0)
Hemoglobin: 15.1 g/dL (ref 13.0–17.0)
Immature Granulocytes: 0 %
Lymphocytes Relative: 12 %
Lymphs Abs: 1.2 10*3/uL (ref 0.7–4.0)
MCH: 32 pg (ref 26.0–34.0)
MCHC: 33.3 g/dL (ref 30.0–36.0)
MCV: 96 fL (ref 80.0–100.0)
Monocytes Absolute: 1.3 10*3/uL — ABNORMAL HIGH (ref 0.1–1.0)
Monocytes Relative: 13 %
Neutro Abs: 7.2 10*3/uL (ref 1.7–7.7)
Neutrophils Relative %: 74 %
Platelets: 266 10*3/uL (ref 150–400)
RBC: 4.72 MIL/uL (ref 4.22–5.81)
RDW: 14.3 % (ref 11.5–15.5)
WBC: 9.8 10*3/uL (ref 4.0–10.5)
nRBC: 0 % (ref 0.0–0.2)

## 2022-06-11 LAB — RESP PANEL BY RT-PCR (RSV, FLU A&B, COVID)  RVPGX2
Influenza A by PCR: NEGATIVE
Influenza B by PCR: NEGATIVE
Resp Syncytial Virus by PCR: NEGATIVE
SARS Coronavirus 2 by RT PCR: NEGATIVE

## 2022-06-11 MED ORDER — ACETAMINOPHEN 325 MG PO TABS
650.0000 mg | ORAL_TABLET | Freq: Once | ORAL | Status: AC
  Administered 2022-06-11: 650 mg via ORAL
  Filled 2022-06-11: qty 2

## 2022-06-11 NOTE — ED Triage Notes (Signed)
Pt arrived via POV, productive cough-yellow mucus, chills, bodyaches, SOB for several days. All worsening. States significant other tested positive for Flu+

## 2022-06-11 NOTE — ED Notes (Signed)
Triage nurse notified of fever.

## 2022-06-12 ENCOUNTER — Telehealth (HOSPITAL_COMMUNITY): Payer: Self-pay | Admitting: Emergency Medicine

## 2022-06-12 LAB — TROPONIN I (HIGH SENSITIVITY): Troponin I (High Sensitivity): 9 ng/L (ref <18)

## 2022-06-12 MED ORDER — BENZONATATE 100 MG PO CAPS: 100.0000 mg | ORAL_CAPSULE | Freq: Three times a day (TID) | ORAL | 0 refills | Status: AC

## 2022-06-12 MED ORDER — IBUPROFEN 800 MG PO TABS
800.0000 mg | ORAL_TABLET | Freq: Once | ORAL | Status: AC
  Administered 2022-06-12: 800 mg via ORAL
  Filled 2022-06-12: qty 1

## 2022-06-12 MED ORDER — HYDROCOD POLI-CHLORPHE POLI ER 10-8 MG/5ML PO SUER: 5.0000 mL | Freq: Two times a day (BID) | ORAL | 0 refills | Status: AC

## 2022-06-12 MED ORDER — HYDROCOD POLI-CHLORPHE POLI ER 10-8 MG/5ML PO SUER
5.0000 mL | Freq: Once | ORAL | Status: AC
  Administered 2022-06-12: 5 mL via ORAL
  Filled 2022-06-12: qty 5

## 2022-06-12 MED ORDER — TUSSIONEX PENNKINETIC ER 10-8 MG/5ML PO SUER: 5.0000 mL | Freq: Every evening | ORAL | 0 refills | Status: DC | PRN

## 2022-06-12 MED ORDER — ACETAMINOPHEN 500 MG PO TABS
1000.0000 mg | ORAL_TABLET | Freq: Once | ORAL | Status: AC
  Administered 2022-06-12: 1000 mg via ORAL
  Filled 2022-06-12: qty 2

## 2022-06-12 MED ORDER — TUSSIONEX PENNKINETIC ER 10-8 MG/5ML PO SUER: 5.0000 mL | Freq: Every evening | ORAL | 0 refills | Status: AC | PRN

## 2022-06-12 NOTE — ED Provider Notes (Signed)
[facility]-EMERGENCY DEPT Provider Note   CSN: [id number] Arrival date & time: 06/11/22  1315     History  Chief Complaint  Patient presents with   Shortness of Breath    Lawrence Alexander is a 51 y.o. male.  Patient with no pertinent past medical history presents today with complaints of cough and congestion. He states that for the past 5 days he has been having a cough productive of yellow mucus as well as chills and bodyaches. States that he feels like his symptoms are getting worse. States he was having some trouble breathing as well. Also endorses chest pain which has developed over the past few days as well. States that his chest only hurts after coughing. States he has been running a fever as well. States he has been around others that tested positive for flu as well. Denies any leg pain or leg swelling. No recent travel, recent surgeries, or history of blood clots. Chest pain only triggered by coughing and is not positional, not brought on by exertion or relieved with rest.    The history is provided by the patient. No language interpreter was used.  Shortness of Breath Associated symptoms: cough        Home Medications Prior to Admission medications   Medication Sig Start Date End Date Taking? Authorizing Provider  albuterol (PROVENTIL HFA;VENTOLIN HFA) 108 (90 BASE) MCG/ACT inhaler Inhale 1-2 puffs into the lungs every 6 (six) hours as needed for wheezing or shortness of breath. Patient not taking: No sig reported 12/14/14   Valarie Cones, Dema Severin, PA-C  chlorpheniramine-HYDROcodone [facility]) 10-8 MG/5ML SUER Take 5 mLs by mouth at bedtime as needed for cough. Patient not taking: Reported on 10/21/2020 12/14/14   Morrell Riddle, PA-C  Diclofenac Sodium CR 100 MG 24 hr tablet Take 1 tablet (100 mg total) by mouth daily. Patient not taking: Reported on 10/21/2020 03/17/18   Kirtland Bouchard, PA-C  EPINEPHrine 0.3 mg/0.3 mL IJ SOAJ injection Inject  one device into the side of the thigh for severe allergic reactions.  May repeat with second device in 5-10 minutes if needed.  After using call 9-11. Patient taking differently: Inject 0.3 mg into the muscle See admin instructions. Inject 0.3 mg into the muscle of the side of a thigh for severe allergic reactions- may repeat with a 2nd injection in 5-10 minutes, if needed. CALL 9-1-1 after using. 02/02/19   Cristina Gong, PA-C  methocarbamol (ROBAXIN) 500 MG tablet Take 1 tablet (500 mg total) by mouth 2 (two) times daily. 10/21/20   Dartha Lodge, PA-C  naproxen (NAPROSYN) 500 MG tablet Take 1 tablet (500 mg total) by mouth 2 (two) times daily. 10/21/20   Dartha Lodge, PA-C      Allergies    Bee venom    Review of Systems   Review of Systems  HENT:  Positive for congestion.   Respiratory:  Positive for cough and shortness of breath.   All other systems reviewed and are negative.   Physical Exam Updated Vital Signs BP (!) 134/90 (BP Location: Right Arm)   Pulse 94   Temp 98.1 F (36.7 C) (Oral)   Resp 16   SpO2 95%  Physical Exam Vitals and nursing note reviewed.  Constitutional:      General: He is not in acute distress.    Appearance: Normal appearance. He is normal weight. He is not ill-appearing, toxic-appearing or diaphoretic.  HENT:  Head: Normocephalic and atraumatic.  Cardiovascular:     Rate and Rhythm: Normal rate and regular rhythm.     Pulses: Normal pulses.     Heart sounds: Normal heart sounds.  Pulmonary:     Effort: Pulmonary effort is normal. No respiratory distress.     Breath sounds: Normal breath sounds.  Abdominal:     Palpations: Abdomen is soft.  Musculoskeletal:        General: Normal range of motion.     Cervical back: Normal range of motion and neck supple.     Right lower leg: No tenderness. No edema.     Left lower leg: No tenderness. No edema.  Skin:    General: Skin is warm and dry.  Neurological:     General: No focal deficit  present.     Mental Status: He is alert.  Psychiatric:        Mood and Affect: Mood normal.        Behavior: Behavior normal.     ED Results / Procedures / Treatments   Labs (all labs ordered are listed, but only abnormal results are displayed) Labs Reviewed  CBC WITH DIFFERENTIAL/PLATELET - Abnormal; Notable for the following components:      Result Value   Monocytes Absolute 1.3 (*)    All other components within normal limits  BASIC METABOLIC PANEL - Abnormal; Notable for the following components:   Glucose, Bld 100 (*)    Creatinine, Ser 1.33 (*)    Calcium 8.8 (*)    All other components within normal limits  RESP PANEL BY RT-PCR (RSV, FLU A&B, COVID)  RVPGX2  TROPONIN I (HIGH SENSITIVITY)    EKG EKG Interpretation  Date/Time:  Thursday June 11 2022 13:56:04 EST Ventricular Rate:  108 PR Interval:  160 QRS Duration: 85 QT Interval:  298 QTC Calculation: 400 R Axis:   -68 Text Interpretation: Sinus tachycardia Left anterior fascicular block Abnormal R-wave progression, early transition ST elevation suggests acute pericarditis Confirmed by Bethann Berkshire [phone]) on 06/11/2022 1:59:20 PM  Radiology DG Chest 2 View  Result Date: 06/11/2022 CLINICAL DATA:  Shortness of breath EXAM: CHEST - 2 VIEW COMPARISON:  08/25/2021 FINDINGS: The heart size and mediastinal contours are within normal limits. Both lungs are clear. The visualized skeletal structures are unremarkable. IMPRESSION: No active cardiopulmonary disease. Electronically Signed   By: Duanne Guess D.O.   On: 06/11/2022 14:55    Procedures Procedures    Medications Ordered in ED Medications  acetaminophen (TYLENOL) tablet 650 mg (650 mg Oral Given 06/11/22 1820)  acetaminophen (TYLENOL) tablet 1,000 mg (1,000 mg Oral Given 06/12/22 0623)  ibuprofen (ADVIL) tablet 800 mg (800 mg Oral Given 06/12/22 0754)  chlorpheniramine-HYDROcodone (TUSSIONEX) 10-8 MG/5ML suspension 5 mL (5 mLs Oral Given 06/12/22  0754)    ED Course/ Medical Decision Making/ A&P                           Medical Decision Making Risk OTC drugs. Prescription drug management.   This patient is a 51 y.o. male who presents to the ED for concern of cough and congestion, this involves an extensive number of treatment options, and is a complaint that carries with it a high risk of complications and morbidity. The emergent differential diagnosis prior to evaluation includes, but is not limited to,  ACS/PE, pneumonia, URI, pericarditis, myocarditis .   This is not an exhaustive differential.   Past Medical History /  Co-morbidities / Social History: none  Physical Exam: Physical exam performed. The pertinent findings include: no acute physical exam findings  Lab Tests: I ordered, and personally interpreted labs.  The pertinent results include:  Creatinine 1.33 improved from previous. Troponin negative   Imaging Studies: I ordered imaging studies including CXR. I independently visualized and interpreted imaging which showed no active disease. I agree with the radiologist interpretation.   Cardiac Monitoring:  The patient was maintained on a cardiac monitor.  My attending physician Dr. Theresia Lo viewed and interpreted the cardiac monitored which showed an underlying rhythm of: no STEMI. Does read as ST elevation concerning for pericarditis, however previous EKG from 2/27 reviewed and appears unchanged per myself and my attending. I agree with this interpretation.   Medications: I ordered medication including ibuprofen, tylenol, and Tussionex  for cough and fevers. Reevaluation of the patient after these medicines showed that the patient improved. I have reviewed the patients home medicines and have made adjustments as needed.    Disposition:  Patient presents today with complaints of cough and congestion. He was initially febrile at 101, improved with tylenol and ibuprofen. Patient's EKG did show concern for  pericarditis, however appears similar to previous tracing in February 2023. Patient also states that his chest only hurts when coughing. Troponin negative. Given this, very low suspicion for pericarditis or myocarditis. Patient endorses some trouble breathing, however is not tachypneic or tachycardic, is speaking in complete sentences and is satting 100% on room air. He is not PERC negative due to age but is low risk Wells. Therefore low suspicion for PE. He also states after cough syrup and ibuprofen his symptoms have significantly improved. Patient negative for covid and flu. Pt CXR negative for acute infiltrate. Patients symptoms are consistent with URI, likely viral etiology. Discussed that antibiotics are not indicated for viral infections. Pt will be discharged with cough medicine and tessalon for symptomatic treatment.  PDMP reviewed. Patient verbalizes understanding and is agreeable with plan. Pt is hemodynamically stable & in NAD prior to dc.  Educated on red flag symptoms that would prompt immediate return.  Patient discharged in stable condition.    I discussed this case with my attending physician Dr. Theresia Lo who cosigned this note including patient's presenting symptoms, physical exam, and planned diagnostics and interventions. Attending physician stated agreement with plan or made changes to plan which were implemented.     Final Clinical Impression(s) / ED Diagnoses Final diagnoses:  Viral URI with cough    Rx / DC Orders ED Discharge Orders          Ordered    chlorpheniramine-HYDROcodone (TUSSIONEX PENNKINETIC ER) 10-8 MG/5ML SUER  At bedtime PRN,   Status:  Discontinued        06/12/22 0853    benzonatate (TESSALON) 100 MG capsule  Every 8 hours        06/12/22 0853    chlorpheniramine-HYDROcodone (TUSSIONEX PENNKINETIC ER) 10-8 MG/5ML SUER  At bedtime PRN        06/12/22 0854          An After Visit Summary was printed and given to the patient.     Silva Bandy,  PA-C 06/12/22 0911    Rexford Maus, DO 06/12/22 [phone]

## 2022-06-12 NOTE — Discharge Instructions (Addendum)
Your work-up in the ER today was reassuring for acute findings. You were swabbed for COVID and FLU which were negative. However, your symptoms are still likely related to an upper respiratory infection. As these are almost always viral in nature, no antibiotics are indicated. I recommend that you get plenty of rest and focus on symptomatic relief which includes Cepacol throat lozenges for sore throat, Mucinex D (orange box) which you can get from behind the counter at your local pharmacy for congestion, and tylenol/ibuprofen as needed for fevers and bodyaches. I have also given you a prescription for cough syrup and tessalon which is a cough suppressant medication for you to take as prescribed as needed for management of your symptoms.  Please do not drive or operate heavy machinery after taking the cough syrup as it can make you very sleepy.  I also recommend:  Increased fluid intake. Sports drinks offer valuable electrolytes, sugars, and fluids.  Breathing heated mist or steam (vaporizer or shower).  Eating chicken soup or other clear broths, and maintaining good nutrition.   Increasing usage of your inhaler if you have asthma.  Return to work when your temperature has returned to normal.  Gargle warm salt water and spit it out for sore throat. Take benadryl or Zyrtec to decrease sinus secretions.  Follow Up: Follow up with your primary care doctor in 5-7 days for recheck of ongoing symptoms.  Return to emergency department for emergent changing or worsening of symptoms.

## 2022-07-03 ENCOUNTER — Inpatient Hospital Stay: Admission: RE | Admit: 2022-07-03 | Payer: Self-pay | Source: Ambulatory Visit

## 2022-08-03 ENCOUNTER — Inpatient Hospital Stay: Admission: RE | Admit: 2022-08-03 | Payer: Commercial Managed Care - HMO | Source: Ambulatory Visit

## 2022-08-04 ENCOUNTER — Other Ambulatory Visit: Payer: Commercial Managed Care - HMO

## 2023-06-02 ENCOUNTER — Encounter (HOSPITAL_COMMUNITY): Payer: Self-pay | Admitting: Pharmacy Technician

## 2023-06-02 ENCOUNTER — Emergency Department (HOSPITAL_COMMUNITY): Payer: Medicaid Other

## 2023-06-02 ENCOUNTER — Other Ambulatory Visit: Payer: Self-pay

## 2023-06-02 ENCOUNTER — Emergency Department (HOSPITAL_COMMUNITY)
Admission: EM | Admit: 2023-06-02 | Discharge: 2023-06-03 | Disposition: A | Discharge status: Home / Self Care | Payer: Medicaid Other | Attending: Emergency Medicine | Admitting: Emergency Medicine

## 2023-06-02 DIAGNOSIS — R059 Cough, unspecified: Secondary | ICD-10-CM | POA: Diagnosis present

## 2023-06-02 DIAGNOSIS — Z1152 Encounter for screening for COVID-19: Secondary | ICD-10-CM | POA: Insufficient documentation

## 2023-06-02 DIAGNOSIS — J40 Bronchitis, not specified as acute or chronic: Secondary | ICD-10-CM | POA: Diagnosis not present

## 2023-06-02 LAB — RESP PANEL BY RT-PCR (RSV, FLU A&B, COVID)  RVPGX2
Influenza A by PCR: NEGATIVE
Influenza B by PCR: NEGATIVE
Resp Syncytial Virus by PCR: NEGATIVE
SARS Coronavirus 2 by RT PCR: NEGATIVE

## 2023-06-02 MED ORDER — PREDNISONE 20 MG PO TABS
60.0000 mg | ORAL_TABLET | Freq: Once | ORAL | Status: AC
  Administered 2023-06-03: 60 mg via ORAL
  Filled 2023-06-02: qty 3

## 2023-06-02 MED ORDER — IPRATROPIUM-ALBUTEROL 0.5-2.5 (3) MG/3ML IN SOLN
3.0000 mL | Freq: Once | RESPIRATORY_TRACT | Status: AC
  Administered 2023-06-03: 3 mL via RESPIRATORY_TRACT
  Filled 2023-06-02: qty 3

## 2023-06-02 NOTE — ED Notes (Signed)
Patient not physically in stretcher yet.

## 2023-06-02 NOTE — ED Triage Notes (Signed)
Pt here with reports of fever, chill, cough, congestion and body aches for the last week.

## 2023-06-02 NOTE — ED Provider Triage Note (Signed)
Emergency Medicine Provider Triage Evaluation Note  Scarlett Presto , a 52 y.o. male  was evaluated in triage.  Pt complains of cough, congestion, rhinorrhea, sneezing, shortness of breath, intermittent subjective fevers and chills, body aches.  Present for the past 7 days.  Worse 3 days ago.  No known sick contacts..  Review of Systems  Positive: As above Negative: As above  Physical Exam  BP (!) 161/95 (BP Location: Right Arm)   Pulse 85   Temp 98 F (36.7 C) (Oral)   Resp 18   SpO2 98%  Gen:   Awake, no distress   Resp:  Normal effort  MSK:   Moves extremities without difficulty  Other:    Medical Decision Making  Medically screening exam initiated at 4:58 PM.  Appropriate orders placed.  Scarlett Presto. was informed that the remainder of the evaluation will be completed by another provider, this initial triage assessment does not replace that evaluation, and the importance of remaining in the ED until their evaluation is complete.  Workup initiated   Michelle Piper, Cordelia Poche 06/02/23 1658

## 2023-06-02 NOTE — ED Notes (Signed)
Patient now in stretcher.

## 2023-06-03 MED ORDER — PREDNISONE 20 MG PO TABS: 60.0000 mg | ORAL_TABLET | Freq: Every day | ORAL | 0 refills | Status: AC

## 2023-06-03 MED ORDER — ALBUTEROL SULFATE HFA 108 (90 BASE) MCG/ACT IN AERS: 1.0000 | INHALATION_SPRAY | Freq: Four times a day (QID) | RESPIRATORY_TRACT | 0 refills | Status: AC | PRN

## 2023-06-03 NOTE — ED Provider Notes (Signed)
[place] EMERGENCY DEPARTMENT AT [facility] Provider Note   CSN: [id number] Arrival date & time: 06/02/23  1539     History  No chief complaint on file.   Lawrence Alexander. is a 52 y.o. male.  52 year old male presents today for concern of cough, congestion, wheezing, ongoing for about a week now.  Endorses generalized myalgias.  Denies any known sick contacts.  Denies any known health problems.  Does have chest pain with coughing spells.  No prior history of DVT or PE.  No history of CAD.  No resting chest pain.  The history is provided by the patient. No language interpreter was used.       Home Medications Prior to Admission medications   Medication Sig Start Date End Date Taking? Authorizing Provider  chlorpheniramine-HYDROcodone (TUSSIONEX) 10-8 MG/5ML Take 5 mLs by mouth 2 (two) times daily. 06/12/22   Lorre Nick, MD  albuterol (PROVENTIL HFA;VENTOLIN HFA) 108 (90 BASE) MCG/ACT inhaler Inhale 1-2 puffs into the lungs every 6 (six) hours as needed for wheezing or shortness of breath. Patient not taking: No sig reported 12/14/14   Valarie Cones, Dema Severin, PA-C  benzonatate (TESSALON) 100 MG capsule Take 1 capsule (100 mg total) by mouth every 8 (eight) hours. 06/12/22   Smoot, Shawn Route, PA-C  chlorpheniramine-HYDROcodone (TUSSIONEX PENNKINETIC ER) 10-8 MG/5ML SUER Take 5 mLs by mouth at bedtime as needed. 06/12/22   Smoot, Shawn Route, PA-C  Diclofenac Sodium CR 100 MG 24 hr tablet Take 1 tablet (100 mg total) by mouth daily. Patient not taking: Reported on 10/21/2020 03/17/18   Kirtland Bouchard, PA-C  EPINEPHrine 0.3 mg/0.3 mL IJ SOAJ injection Inject one device into the side of the thigh for severe allergic reactions.  May repeat with second device in 5-10 minutes if needed.  After using call 9-11. Patient taking differently: Inject 0.3 mg into the muscle See admin instructions. Inject 0.3 mg into the muscle of the side of a thigh for severe allergic reactions- may repeat  with a 2nd injection in 5-10 minutes, if needed. CALL 9-1-1 after using. 02/02/19   Cristina Gong, PA-C  methocarbamol (ROBAXIN) 500 MG tablet Take 1 tablet (500 mg total) by mouth 2 (two) times daily. 10/21/20   Rosezella Rumpf, PA-C  naproxen (NAPROSYN) 500 MG tablet Take 1 tablet (500 mg total) by mouth 2 (two) times daily. 10/21/20   Rosezella Rumpf, PA-C      Allergies    Bee venom    Review of Systems   Review of Systems  Constitutional:  Negative for chills and fever.  HENT:  Positive for congestion. Negative for sore throat.   Respiratory:  Positive for cough and shortness of breath.   Cardiovascular:  Positive for chest pain.  All other systems reviewed and are negative.   Physical Exam Updated Vital Signs BP (!) 125/96 (BP Location: Left Arm)   Pulse 73   Temp 98.1 F (36.7 C) (Oral)   Resp 16   SpO2 97%  Physical Exam Vitals and nursing note reviewed.  Constitutional:      General: He is not in acute distress.    Appearance: Normal appearance. He is not ill-appearing.  HENT:     Head: Normocephalic and atraumatic.     Nose: Nose normal.  Eyes:     Conjunctiva/sclera: Conjunctivae normal.  Cardiovascular:     Rate and Rhythm: Normal rate and regular rhythm.  Pulmonary:     Effort: Pulmonary effort is  normal. No respiratory distress.     Breath sounds: Wheezing present. No rales.  Musculoskeletal:        General: No deformity. Normal range of motion.     Cervical back: Normal range of motion.  Skin:    Findings: No rash.  Neurological:     Mental Status: He is alert.     ED Results / Procedures / Treatments   Labs (all labs ordered are listed, but only abnormal results are displayed) Labs Reviewed  RESP PANEL BY RT-PCR (RSV, FLU A&B, COVID)  RVPGX2    EKG None  Radiology DG Chest 2 View  Result Date: 06/02/2023 CLINICAL DATA:  Cough.  Shortness of breath. EXAM: CHEST - 2 VIEW COMPARISON:  Chest radiograph dated June 11, 2022. FINDINGS:  The heart size and mediastinal contours are within normal limits. No focal consolidation, pleural effusion, or pneumothorax. No acute osseous abnormality. IMPRESSION: No acute cardiopulmonary findings. Electronically Signed   By: Hart Robinsons M.D.   On: 06/02/2023 18:34    Procedures Procedures    Medications Ordered in ED Medications  predniSONE (DELTASONE) tablet 60 mg (has no administration in time range)  ipratropium-albuterol (DUONEB) 0.5-2.5 (3) MG/3ML nebulizer solution 3 mL (has no administration in time range)    ED Course/ Medical Decision Making/ A&P                                 Medical Decision Making Risk Prescription drug management.   52 year old male presents today for concern of cough, congestion, wheezing.  Symptoms ongoing for a week.  Hemodynamically stable.  X-ray without evidence of pneumonia.  Respiratory panel negative.  Likely bronchitis.  Improved after steroids and DuoNeb.  Discharged with albuterol and prednisone.  Return precaution discussed.  Referral given to establish a PCP.  Patient voices understanding and is in agreement with plan.  Final Clinical Impression(s) / ED Diagnoses Final diagnoses:  Bronchitis    Rx / DC Orders ED Discharge Orders          Ordered    predniSONE (DELTASONE) 20 MG tablet  Daily with breakfast        06/03/23 0214    albuterol (VENTOLIN HFA) 108 (90 Base) MCG/ACT inhaler  Every 6 hours PRN        06/03/23 0214              Marita Kansas, PA-C 06/03/23 0216    Zadie Rhine, MD 06/03/23 [phone]

## 2023-06-03 NOTE — Discharge Instructions (Addendum)
Your work today was reassuring.  Likely bronchitis.  Steroid sent into the pharmacy.  Albuterol inhaler sent into the pharmacy.  Return for any concerning symptoms.  Establish a primary care provider.

## 2023-06-13 ENCOUNTER — Encounter (HOSPITAL_BASED_OUTPATIENT_CLINIC_OR_DEPARTMENT_OTHER): Payer: Self-pay

## 2023-06-13 DIAGNOSIS — J209 Acute bronchitis, unspecified: Secondary | ICD-10-CM | POA: Diagnosis not present

## 2023-06-13 DIAGNOSIS — Z20822 Contact with and (suspected) exposure to covid-19: Secondary | ICD-10-CM | POA: Insufficient documentation

## 2023-06-13 DIAGNOSIS — R059 Cough, unspecified: Secondary | ICD-10-CM | POA: Diagnosis present

## 2023-06-13 LAB — RESP PANEL BY RT-PCR (RSV, FLU A&B, COVID)  RVPGX2
Influenza A by PCR: NEGATIVE
Influenza B by PCR: NEGATIVE
Resp Syncytial Virus by PCR: NEGATIVE
SARS Coronavirus 2 by RT PCR: NEGATIVE

## 2023-06-13 NOTE — ED Triage Notes (Signed)
Pt to triage c/o cough flu like symptoms x 2 weeks. Pt was seen for same 12/4 and states he hasn't improved. Fever body aches chills in last 24 hours. Pt on room air, VSS NAD.

## 2023-06-14 ENCOUNTER — Emergency Department (HOSPITAL_BASED_OUTPATIENT_CLINIC_OR_DEPARTMENT_OTHER)
Admission: EM | Admit: 2023-06-14 | Discharge: 2023-06-14 | Disposition: A | Discharge status: Home / Self Care | Payer: Medicaid Other | Attending: Emergency Medicine | Admitting: Emergency Medicine

## 2023-06-14 DIAGNOSIS — J209 Acute bronchitis, unspecified: Secondary | ICD-10-CM

## 2023-06-14 MED ORDER — AZITHROMYCIN 250 MG PO TABS: 250.0000 mg | ORAL_TABLET | Freq: Every day | ORAL | 0 refills | Status: AC

## 2023-06-14 MED ORDER — GUAIFENESIN-CODEINE 100-10 MG/5ML PO SOLN: 10.0000 mL | Freq: Four times a day (QID) | ORAL | 0 refills | Status: AC | PRN

## 2023-06-14 MED ORDER — AZITHROMYCIN 250 MG PO TABS
500.0000 mg | ORAL_TABLET | Freq: Once | ORAL | Status: AC
  Administered 2023-06-14: 500 mg via ORAL
  Filled 2023-06-14: qty 2

## 2023-06-14 MED ORDER — GUAIFENESIN-CODEINE 100-10 MG/5ML PO SOLN
10.0000 mL | Freq: Once | ORAL | Status: AC
  Administered 2023-06-14: 10 mL via ORAL
  Filled 2023-06-14: qty 10

## 2023-06-14 NOTE — Discharge Instructions (Addendum)
Begin taking Zithromax as prescribed.  Begin taking Robitussin with codeine as prescribed as needed for cough.  Follow-up with primary doctor if not improving in the next week.

## 2023-06-14 NOTE — ED Provider Notes (Signed)
[place] EMERGENCY DEPARTMENT AT [facility] Provider Note   CSN: [id number] Arrival date & time: 06/13/23  2238     History  Chief Complaint  Patient presents with   Cough    Lawrence Alexander. is a 52 y.o. male.  Patient is a 52 year old male with no significant past medical history.  Patient presenting today with complaints of cough.  He has had cough, body aches, and feeling generally unwell for the past 2 weeks.  He was seen here 1 week ago and prescribed prednisone and an inhaler, but this is not helping.  He describes persistent cough that is intermittently productive of yellow sputum.  No shortness of breath or chest pain.  No ill contacts.  The history is provided by the patient.       Home Medications Prior to Admission medications   Medication Sig Start Date End Date Taking? Authorizing Provider  chlorpheniramine-HYDROcodone (TUSSIONEX) 10-8 MG/5ML Take 5 mLs by mouth 2 (two) times daily. 06/12/22   Lorre Nick, MD  albuterol (VENTOLIN HFA) 108 (90 Base) MCG/ACT inhaler Inhale 1-2 puffs into the lungs every 6 (six) hours as needed for wheezing or shortness of breath. 06/03/23   Karie Mainland, Amjad, PA-C  benzonatate (TESSALON) 100 MG capsule Take 1 capsule (100 mg total) by mouth every 8 (eight) hours. 06/12/22   Smoot, Shawn Route, PA-C  chlorpheniramine-HYDROcodone (TUSSIONEX PENNKINETIC ER) 10-8 MG/5ML SUER Take 5 mLs by mouth at bedtime as needed. 06/12/22   Smoot, Shawn Route, PA-C  Diclofenac Sodium CR 100 MG 24 hr tablet Take 1 tablet (100 mg total) by mouth daily. Patient not taking: Reported on 10/21/2020 03/17/18   Kirtland Bouchard, PA-C  EPINEPHrine 0.3 mg/0.3 mL IJ SOAJ injection Inject one device into the side of the thigh for severe allergic reactions.  May repeat with second device in 5-10 minutes if needed.  After using call 9-11. Patient taking differently: Inject 0.3 mg into the muscle See admin instructions. Inject 0.3 mg into the muscle of the side of a  thigh for severe allergic reactions- may repeat with a 2nd injection in 5-10 minutes, if needed. CALL 9-1-1 after using. 02/02/19   Cristina Gong, PA-C  methocarbamol (ROBAXIN) 500 MG tablet Take 1 tablet (500 mg total) by mouth 2 (two) times daily. 10/21/20   Rosezella Rumpf, PA-C  naproxen (NAPROSYN) 500 MG tablet Take 1 tablet (500 mg total) by mouth 2 (two) times daily. 10/21/20   Rosezella Rumpf, PA-C      Allergies    Bee venom    Review of Systems   Review of Systems  All other systems reviewed and are negative.   Physical Exam Updated Vital Signs BP 139/89   Pulse 66   Temp 98.2 F (36.8 C) (Oral)   Resp 18   Wt 94.1 kg   SpO2 99%   BMI 32.49 kg/m  Physical Exam Vitals and nursing note reviewed.  Constitutional:      General: He is not in acute distress.    Appearance: He is well-developed. He is not diaphoretic.  HENT:     Head: Normocephalic and atraumatic.  Cardiovascular:     Rate and Rhythm: Normal rate and regular rhythm.     Heart sounds: No murmur heard.    No friction rub.  Pulmonary:     Effort: Pulmonary effort is normal. No respiratory distress.     Breath sounds: Normal breath sounds. No wheezing or rales.  Abdominal:  General: Bowel sounds are normal. There is no distension.     Palpations: Abdomen is soft.     Tenderness: There is no abdominal tenderness.  Musculoskeletal:        General: Normal range of motion.     Cervical back: Normal range of motion and neck supple.  Skin:    General: Skin is warm and dry.  Neurological:     Mental Status: He is alert and oriented to person, place, and time.     Coordination: Coordination normal.     ED Results / Procedures / Treatments   Labs (all labs ordered are listed, but only abnormal results are displayed) Labs Reviewed  RESP PANEL BY RT-PCR (RSV, FLU A&B, COVID)  RVPGX2    EKG None  Radiology No results found.  Procedures Procedures    Medications Ordered in  ED Medications  azithromycin (ZITHROMAX) tablet 500 mg (has no administration in time range)  guaiFENesin-codeine 100-10 MG/5ML solution 10 mL (has no administration in time range)    ED Course/ Medical Decision Making/ A&P  Patient presenting with URI symptoms as described in the HPI.  His symptoms have been prolonged and ongoing for greater than 2 weeks.  His COVID/flu/RSV swab is negative today.  Due to the duration of symptoms, I feel it is reasonable to prescribe a course of antibiotics.  He will be given Zithromax and prescribe Robitussin with codeine for cough.  To follow-up as needed.  Final Clinical Impression(s) / ED Diagnoses Final diagnoses:  None    Rx / DC Orders ED Discharge Orders     None         Geoffery Lyons, MD 06/14/23 [phone]
